# Patient Record
Sex: Male | Born: 1985 | Race: White | Hispanic: No | Marital: Single | Smoking: Never smoker
Health system: Southern US, Community
[De-identification: ages and names within clinical notes are randomized; demographics above are authoritative.]

## PROBLEM LIST (undated history)

## (undated) DIAGNOSIS — F32A Depression, unspecified: Secondary | ICD-10-CM

## (undated) DIAGNOSIS — F329 Major depressive disorder, single episode, unspecified: Secondary | ICD-10-CM

---

## 2012-09-15 ENCOUNTER — Ambulatory Visit: Payer: Self-pay | Admitting: Internal Medicine

## 2012-09-15 LAB — RAPID STREP-A WITH REFLX: Micro Text Report: NEGATIVE

## 2012-09-15 LAB — RAPID INFLUENZA A&B ANTIGENS

## 2012-09-17 LAB — BETA STREP CULTURE(ARMC)

## 2015-07-05 ENCOUNTER — Emergency Department
Admission: EM | Admit: 2015-07-05 | Discharge: 2015-07-06 | Disposition: A | Discharge status: Other Acute Inpatient Hospital | Payer: Commercial Managed Care - PPO | Attending: Emergency Medicine | Admitting: Emergency Medicine

## 2015-07-05 ENCOUNTER — Encounter: Payer: Self-pay | Admitting: Emergency Medicine

## 2015-07-05 ENCOUNTER — Observation Stay (HOSPITAL_COMMUNITY)
Admission: AD | Admit: 2015-07-05 | Payer: Federal, State, Local not specified - Other | Source: Intra-hospital | Admitting: Psychiatry

## 2015-07-05 DIAGNOSIS — Z791 Long term (current) use of non-steroidal anti-inflammatories (NSAID): Secondary | ICD-10-CM | POA: Insufficient documentation

## 2015-07-05 DIAGNOSIS — F322 Major depressive disorder, single episode, severe without psychotic features: Secondary | ICD-10-CM | POA: Diagnosis not present

## 2015-07-05 DIAGNOSIS — F329 Major depressive disorder, single episode, unspecified: Secondary | ICD-10-CM

## 2015-07-05 DIAGNOSIS — F332 Major depressive disorder, recurrent severe without psychotic features: Secondary | ICD-10-CM | POA: Insufficient documentation

## 2015-07-05 DIAGNOSIS — T1491 Suicide attempt: Secondary | ICD-10-CM | POA: Insufficient documentation

## 2015-07-05 DIAGNOSIS — F4325 Adjustment disorder with mixed disturbance of emotions and conduct: Secondary | ICD-10-CM

## 2015-07-05 DIAGNOSIS — R45851 Suicidal ideations: Secondary | ICD-10-CM

## 2015-07-05 DIAGNOSIS — F32A Depression, unspecified: Secondary | ICD-10-CM

## 2015-07-05 DIAGNOSIS — T1491XA Suicide attempt, initial encounter: Secondary | ICD-10-CM

## 2015-07-05 LAB — COMPREHENSIVE METABOLIC PANEL
ALT: 14 U/L — ABNORMAL LOW (ref 17–63)
ANION GAP: 6 (ref 5–15)
AST: 16 U/L (ref 15–41)
Albumin: 4.5 g/dL (ref 3.5–5.0)
Alkaline Phosphatase: 93 U/L (ref 38–126)
BUN: 13 mg/dL (ref 6–20)
CHLORIDE: 108 mmol/L (ref 101–111)
CO2: 29 mmol/L (ref 22–32)
Calcium: 9.8 mg/dL (ref 8.9–10.3)
Creatinine, Ser: 0.93 mg/dL (ref 0.61–1.24)
Glucose, Bld: 99 mg/dL (ref 65–99)
POTASSIUM: 3.8 mmol/L (ref 3.5–5.1)
Sodium: 143 mmol/L (ref 135–145)
Total Bilirubin: 0.5 mg/dL (ref 0.3–1.2)
Total Protein: 7.7 g/dL (ref 6.5–8.1)

## 2015-07-05 LAB — ACETAMINOPHEN LEVEL

## 2015-07-05 LAB — URINE DRUG SCREEN, QUALITATIVE (ARMC ONLY)
AMPHETAMINES, UR SCREEN: NOT DETECTED
BENZODIAZEPINE, UR SCRN: NOT DETECTED
Barbiturates, Ur Screen: NOT DETECTED
Cannabinoid 50 Ng, Ur ~~LOC~~: NOT DETECTED
Cocaine Metabolite,Ur ~~LOC~~: NOT DETECTED
MDMA (ECSTASY) UR SCREEN: NOT DETECTED
METHADONE SCREEN, URINE: NOT DETECTED
Opiate, Ur Screen: NOT DETECTED
Phencyclidine (PCP) Ur S: NOT DETECTED
TRICYCLIC, UR SCREEN: NOT DETECTED

## 2015-07-05 LAB — SALICYLATE LEVEL

## 2015-07-05 LAB — CBC
HCT: 47.6 % (ref 40.0–52.0)
Hemoglobin: 16.1 g/dL (ref 13.0–18.0)
MCH: 30.9 pg (ref 26.0–34.0)
MCHC: 33.9 g/dL (ref 32.0–36.0)
MCV: 91.3 fL (ref 80.0–100.0)
PLATELETS: 219 10*3/uL (ref 150–440)
RBC: 5.22 MIL/uL (ref 4.40–5.90)
RDW: 12.9 % (ref 11.5–14.5)
WBC: 7.5 10*3/uL (ref 3.8–10.6)

## 2015-07-05 LAB — ETHANOL: ALCOHOL ETHYL (B): 12 mg/dL — AB (ref <5)

## 2015-07-05 NOTE — BH Assessment (Signed)
Assessment Note  Hector Decker is an 29 y.o. male presenting to the ED after attempting suicide by hanging himself.  According to the patient he has been having marital issues with his wife, he recently cheated on her and feels very ashamed. Pt states he attempted to hang himself last night, unsuccessfully. He states he met with his counselor today who urged him to come to the emergency department for evaluation. Pt reports he does not feel safe returning home in his present state of mind.   Diagnosis: Suicidal  Past Medical History: History reviewed. No pertinent past medical history.  History reviewed. No pertinent past surgical history.  Family History: History reviewed. No pertinent family history.  Social History:  reports that he has never smoked. He has never used smokeless tobacco. He reports that he drinks about 3.6 oz of alcohol per week. He reports that he uses illicit drugs (Marijuana).  Additional Social History:  Alcohol / Drug Use History of alcohol / drug use?: No history of alcohol / drug abuse  CIWA: CIWA-Ar BP: 122/69 mmHg Pulse Rate: 65 COWS:    Allergies: No Known Allergies  Home Medications:  (Not in a hospital admission)  OB/GYN Status:  No LMP for male patient.  General Assessment Data Location of Assessment: [facility] ED TTS Assessment: In system Is this a Tele or Face-to-Face Assessment?: Face-to-Face Is this an Initial Assessment or a Re-assessment for this encounter?: Initial Assessment Marital status: Married [place] name: N/A Is patient pregnant?: No Pregnancy Status: No Living Arrangements: Spouse/significant other Can pt return to current living arrangement?: Yes Admission Status: Involuntary Is patient capable of signing voluntary admission?: No Referral Source: Self/Family/Friend Insurance type: None  Medical Screening Exam ([place]) Medical Exam completed: Yes  Crisis Care Plan Living Arrangements: Spouse/significant other Legal  Guardian: Other: (Self) [facility] of [facility]rist[facility] Name of Therapist: [facility]  Education Status Is patient currently in school?: No Current Grade: N/A Highest grade of school patient has completed: 12th Name of school: N/A Contact person: N/A  Risk to self with the past 6 months Suicidal Ideation: Yes-Currently Present Has patient been a risk to self within the past 6 months prior to admission? : No Suicidal Intent: Yes-Currently Present Has patient had any suicidal intent within the past 6 months prior to admission? : No Is patient at risk for suicide?: Yes Suicidal Plan?: Yes-Currently Present Has patient had any suicidal plan within the past 6 months prior to admission? : No Specify Current Suicidal Plan: Pt tried to hang self Access to Means: Yes Specify Access to Suicidal Means: Pt has access to rope What has been your use of drugs/alcohol within the last 12 months?: Occasional beer Previous Attempts/Gestures: No How many times?: 0 Other Self Harm Risks: None Triggers for Past Attempts: Spouse contact Intentional Self Injurious Behavior: None Family Suicide History: No Recent stressful life event(s): Conflict (Comment) (Relationship issues) Persecutory voices/beliefs?: No Depression: Yes Depression Symptoms: Feeling worthless/self pity Substance abuse history and/or treatment for substance abuse?: No Suicide prevention information given to non-admitted patients: Not applicable  Risk to Others within the past 6 months Homicidal Ideation: No Does patient have any lifetime risk of violence toward others beyond the six months prior to admission? : No Thoughts of Harm to Others: No Current Homicidal Intent: No Current Homicidal Plan: No Access to Homicidal Means: No Identified Victim: N/A History of harm to others?: No Assessment of Violence: None Noted Violent Behavior Description: N/A Does patient have access to  weapons?: No Criminal Charges Pending?:  No Does patient have a court date: No Is patient on probation?: No  Psychosis Hallucinations: None noted Delusions: None noted  Mental Status Report Appearance/Hygiene: In scrubs Eye Contact: Good Motor Activity: Freedom of movement Speech: Logical/coherent Level of Consciousness: Alert Mood: Depressed, Sad Affect: Depressed, Sad Anxiety Level: Minimal Thought Processes: Coherent Judgement: Partial Orientation: Person, Place, Time, Situation Obsessive Compulsive Thoughts/Behaviors: None  Cognitive Functioning Concentration: Normal Memory: Recent Intact, Remote Intact IQ: Average Insight: Poor Impulse Control: Poor Appetite: Good Weight Loss: 0 Weight Gain: 0 Sleep: No Change Total Hours of Sleep: 8 Vegetative Symptoms: None  ADLScreening [facility] Assessment Services) Patient's cognitive ability adequate to safely complete daily activities?: Yes Patient able to express need for assistance with ADLs?: Yes Independently performs ADLs?: Yes (appropriate for developmental age)  Prior Inpatient Therapy Prior Inpatient Therapy: Yes Prior Therapy Dates: N/A Prior Therapy Facilty/Provider(s): N/A Reason for Treatment: N/A  Prior Outpatient Therapy Prior Outpatient Therapy: Yes Prior Therapy Dates: current Prior [facility]): [facility] Reason for Treatment: Depression Does patient have an ACCT team?: No Does patient have Intensive In-House Services?  : No Does patient have [facility] services? : No Does patient have P4CC services?: No  ADL Screening (condition at time of admission) Patient's cognitive ability adequate to safely complete daily activities?: Yes Patient able to express need for assistance with ADLs?: Yes Independently performs ADLs?: Yes (appropriate for developmental age)       Abuse/Neglect Assessment (Assessment to be complete while patient is alone) Physical Abuse: Denies Verbal Abuse: Denies Sexual Abuse: Denies Exploitation of  patient/patient's resources: Denies Self-Neglect: Denies Values / Beliefs Cultural Requests During Hospitalization: None Spiritual Requests During Hospitalization: None Consults Spiritual Care Consult Needed: No Social Work Consult Needed: No Regulatory affairs officer (For Healthcare) Does patient have an advance directive?: No    Additional Information 1:1 In Past 12 Months?: No CIRT Risk: No Elopement Risk: No Does patient have medical clearance?: Yes     Disposition:  Disposition Initial Assessment Completed for this Encounter: Yes Disposition of Patient: Other dispositions Other disposition(s): Other (Comment) (Psych MD consult)  On Site Evaluation by:   Reviewed with Physician:    Oneita Hurt 07/05/2015 10:48 PM

## 2015-07-05 NOTE — ED Provider Notes (Signed)
[facility] Emergency Department Provider Note  Time seen: 6:09 PM  I have reviewed the triage vital signs and the nursing notes.   HISTORY  Chief Complaint Suicide Attempt    HPI Hector Decker is a 29 y.o. male with no past medical history who presents the emergency department after a suicide attempt. According to the patient he has been having marital issues with his wife, he recently cheated on her and feels very ashamed. States he attempted to hang himself last night, unsuccessfully. Today him and his wife went to a counselor who urged him to come to the emergency department for evaluation. Denies any medical complaints. States continued depression, with suicidal thoughts.     History reviewed. No pertinent past medical history.  There are no active problems to display for this patient.   History reviewed. No pertinent past surgical history.  Current Outpatient Rx  Name  Route  Sig  Dispense  Refill  . naproxen (NAPROSYN) 500 MG tablet   Oral   Take 500 mg by mouth 2 (two) times daily with a meal.           Allergies Review of patient's allergies indicates no known allergies.  History reviewed. No pertinent family history.  Social History Social History  Substance Use Topics  . Smoking status: Never Smoker   . Smokeless tobacco: Never Used  . Alcohol Use: 3.6 oz/week    6 Cans of beer per week    Review of Systems Constitutional: Negative for fever. ENT: Denies neck pain Cardiovascular: Negative for chest pain. Respiratory: Negative for shortness of breath. Gastrointestinal: Negative for abdominal pain Skin: Negative for rash. Negative for abrasions. Neurological: Negative for headache Psychiatric:Depression 10-point ROS otherwise negative.  ____________________________________________   PHYSICAL EXAM:  VITAL SIGNS: ED Triage Vitals  Enc Vitals Group     BP 07/05/15 1703 139/76 mmHg     Pulse Rate 07/05/15 1703 54    Resp --      Temp 07/05/15 1703 97.5 F (36.4 C)     Temp Source 07/05/15 1703 Oral     SpO2 07/05/15 1703 99 %     Weight 07/05/15 1703 185 lb (83.915 kg)     Height 07/05/15 1703  (1.727 m)     Head Cir --      Peak Flow --      Pain Score --      Pain Loc --      Pain Edu? --      Excl. in GC? --     Constitutional: Alert and oriented. Well appearing and in no distress. Eyes: Normal exam ENT   Head: Normocephalic and atraumatic.   Mouth/Throat: Mucous membranes are moist. Cardiovascular: Normal rate, regular rhythm. No murmur Respiratory: Normal respiratory effort without tachypnea nor retractions. Breath sounds are clear Gastrointestinal: Soft and nontender. No distention.  Musculoskeletal: Nontender with normal range of motion in all extremities.  Neurologic:  Normal speech and language. No gross focal neurologic deficits  Skin:  Skin is warm, dry and intact.  Psychiatric: Flat affect, admits to depression, admits suicide attempt.  ____________________________________________   INITIAL IMPRESSION / ASSESSMENT AND PLAN / ED COURSE  Pertinent labs & imaging results that were available during my care of the patient were reviewed by me and considered in my medical decision making (see chart for details).  Patient presents after unsuccessful suicide attempt last night due to recent marital issues. Patient depressed. We will place the patient under  an involuntary commitment until the patient can be evaluated by psychiatry. No medical complaints at this time. Blood work is largely within normal limits besides a slightly elevated alcohol. The patient admits to having had been under the influence of alcohol last night and when he attempted to hang himself. No neck abrasions or rash noted on exam.  ____________________________________________   FINAL CLINICAL IMPRESSION(S) / ED DIAGNOSES  Suicide attempt Depression   Minna AntisKevin Bradleigh Sonnen, MD 07/05/15 [phone]

## 2015-07-05 NOTE — ED Notes (Signed)
Report received from Atilade RN 

## 2015-07-05 NOTE — ED Notes (Signed)
Pt presents after attempting suicide last night. He states that he tried once before, but that it has been a long time. He reports that he has been feeling depressed for a couple of weeks, and that he had an argument with his wife, which caused him to attempt to hang himself. He was discovered by his wife, but was not injured, so he did not come in last night. He denies suicidal ideation "right this minute."

## 2015-07-05 NOTE — ED Notes (Signed)
Patient to ED-[facility] from ED ambulatory without difficulty to room #4.  Patient is alert and oriented, calm and cooperative and in no apparent distress currently.  Patient is oriented to the unit.  Patient denies any pain currently.  Patient denies any suicidal ideation, homicidal ideation, auditory or visual hallucinations at the present time.  Patient is made aware of security cameras and q.15 minute safety checks.  Patient is encouraged to notify staff with any of any questions or concerns.

## 2015-07-05 NOTE — ED Notes (Signed)
This RN given the pt's wedding band by Dena BilletAti O, RN; item placed in the pt's belongings bag.

## 2015-07-06 ENCOUNTER — Inpatient Hospital Stay
Admit: 2015-07-06 | Discharge: 2015-07-10 | DRG: 882 | Disposition: A | Discharge status: Home / Self Care | Payer: 59 | Attending: Psychiatry | Admitting: Psychiatry

## 2015-07-06 DIAGNOSIS — Z915 Personal history of self-harm: Secondary | ICD-10-CM

## 2015-07-06 DIAGNOSIS — F4325 Adjustment disorder with mixed disturbance of emotions and conduct: Principal | ICD-10-CM | POA: Diagnosis present

## 2015-07-06 DIAGNOSIS — F332 Major depressive disorder, recurrent severe without psychotic features: Secondary | ICD-10-CM | POA: Diagnosis not present

## 2015-07-06 DIAGNOSIS — G4700 Insomnia, unspecified: Secondary | ICD-10-CM | POA: Diagnosis present

## 2015-07-06 DIAGNOSIS — F322 Major depressive disorder, single episode, severe without psychotic features: Secondary | ICD-10-CM | POA: Diagnosis not present

## 2015-07-06 DIAGNOSIS — R45851 Suicidal ideations: Secondary | ICD-10-CM | POA: Diagnosis present

## 2015-07-06 HISTORY — DX: Depression, unspecified: F32.A

## 2015-07-06 HISTORY — DX: Major depressive disorder, single episode, unspecified: F32.9

## 2015-07-06 MED ORDER — TRAZODONE HCL 50 MG PO TABS
50.0000 mg | ORAL_TABLET | Freq: Every evening | ORAL | Status: DC | PRN
Start: 2015-07-06 — End: 2015-07-06

## 2015-07-06 MED ORDER — HYDROXYZINE HCL 25 MG PO TABS: 25.0000 mg | ORAL_TABLET | ORAL | Status: DC | PRN

## 2015-07-06 NOTE — ED Notes (Signed)
Patient currently denies SI/HI/AVH and pain. Patient states that he really is not sure what he needs psychologically but he has a lot of guilt related to cheating on his wife. Patient does say that he feels safer being in the hospital than at home. Patient states that at this point he does not have any thoughts of self harm and contracted with nurse to make staff aware if these thoughts were to occur.

## 2015-07-06 NOTE — ED Notes (Signed)
Patient currently taking a shower. Patient shows no signs of distress and has no complaints at this time. Maintained on 15 minute checks and observation by security camera for safety.

## 2015-07-06 NOTE — ED Notes (Signed)
Patient received breakfast tray 

## 2015-07-06 NOTE — BHH Counselor (Signed)
Patient has been evaluated by Dr. Toni Amendlapacs and meets criteria for inpatient admission.  [facility], assigned to room 319A, call report at 10 pm.

## 2015-07-06 NOTE — ED Notes (Signed)
Patient resting quietly in room. No noted distress or abnormal behaviors noted. Will continue 15 minute checks and observation by security camera for safety. 

## 2015-07-06 NOTE — ED Provider Notes (Signed)
-----------------------------------------   8:09 PM on 07/06/2015 -----------------------------------------   Blood pressure 118/72, pulse 71, temperature 98.4 F (36.9 C), temperature source Oral, resp. rate 18, height 5\' 8"  (1.727 m), weight 83.915 kg, SpO2 97 %.  Admit per Dr. Luvenia Reddenlapacs  Bernetha Anschutz, MD 07/06/15 2009

## 2015-07-06 NOTE — ED Notes (Signed)
Number of wife Maralyn SagoSarah- [phone]. Wife was notified that doctor was to see patient this evening.

## 2015-07-06 NOTE — ED Notes (Signed)
Patient asleep in room. No noted distress or abnormal behavior. Will continue 15 minute checks and observation by security cameras for safety. 

## 2015-07-06 NOTE — ED Notes (Signed)
Report received from Reather ConverseKarena T., RN. Pt. Alert and oriented in no distress verbalizing having SI; denies having HI, AVH and pain.  Pt. Instructed to come to me with problems or concerns.Will continue to monitor for safety via security cameras and Q 15 minute checks.

## 2015-07-06 NOTE — ED Notes (Signed)
Pt. Noted in room watching the tv;. No complaints or concerns voiced. No distress or abnormal behavior noted; to be admitted to the inpatient behavioral medicine unit; per Dr. Clapacs;  will continue to monitor with security cameras. Q 15 minute rounds continue. 

## 2015-07-06 NOTE — ED Notes (Signed)
Patient in the bed resting and watching television. No signs of distress noted. Maintained on 15 minute checks and observation by security camera for safety.

## 2015-07-06 NOTE — ED Notes (Signed)
Pt. Noted in  room watching the tv.;. No complaints or concerns voiced. No distress or abnormal behavior noted. Will continue to monitor with security cameras. Q 15 minute rounds continue. 

## 2015-07-06 NOTE — ED Notes (Signed)
Patient's wife came to visit him on the unit. Wife requests to be notified once the psychiatrist sees the patient. Patient shows no signs of distress at this time.  Maintained on 15 minute checks and observation by security camera for safety.

## 2015-07-06 NOTE — ED Notes (Signed)
ENVIRONMENTAL ASSESSMENT Potentially harmful objects out of patient reach: Yes Personal belongings secured: Yes Patient dressed in hospital provided attire only: Yes Plastic bags out of patient reach: Yes Patient care equipment (cords, cables, call bells, lines, and drains) shortened, removed, or accounted for: Yes Equipment and supplies removed from bottom of stretcher: Yes Potentially toxic materials out of patient reach: Yes Sharps container removed or out of patient reach: Yes  Patient currently in room sleeping. No signs of distress noted. Maintained on 15 minute checks and observation by security camera for safety.  

## 2015-07-06 NOTE — Consult Note (Signed)
[facility] Face-to-Face Psychiatry Consult   Reason for Consult:  Consult for this 29 year old man brought into the hospital after attempting to hang himself at home Referring Physician:  [place] Patient Identification: Hector Decker MRN:  [id number] Principal Diagnosis: Depression, major, single episode, severe ([place]) Diagnosis:   Patient Active Problem List   Diagnosis Date Noted  . Depression, major, single episode, severe ([place]) [F32.2] 07/06/2015  . Suicidal ideation [R45.851] 07/06/2015  . Adjustment disorder with mixed disturbance of emotions and conduct [F43.25] 07/06/2015    Total Time spent with patient: 1 hour  Subjective:   Hector Decker is a 29 y.o. male patient admitted with "I tried to hang myself and my wife stopped me".  HPI:  Information from the patient and the chart. Patient interviewed. Chart reviewed. Referral Notes reviewed. Labs reviewed. Patient states that on Wednesday he tried to hang himself using some rock climbing apparatus at home. He says he actually managed to hang briefly what his wife walked in on him and stopped him. Patient has been having suicidal ideation intermittently for the last week which has escalated. Mood is been sad and depressed for several weeks which is also escalated in the past week. He is sleeping poorly waking up frequently with panic attacks. He eats adequately. Denies that he's having any hallucinations. 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 The 2 of them of been going to marital counseling together but when she discovered that he had been cheating on her things got even worse. He feels very ashamed and negative about himself. Can't concentrate or focus. He admits that he drinks but says that it is rarely a problem. He had had some alcohol when he tried to hang himself but says that on average she only drinks about  1 beer every day or less. Denies that he abuses any other drugs. He is not currently on any psychiatric medicine but has been seeing a marital counselor.  Social history: Patient is married. He and his wife been together for 5 years. No children. 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  Medical history: Patient denies any significant medical problems. Not on any current medication. No history of heart disease high blood pressure or diabetes.  Substance abuse history: Patient says that his drinking has never been a problem and he does not use other drugs. Denies any substance abuse problems in the past.  Past Psychiatric History: Patient states that he made a suicide attempt at age 42 when his mother discovered that he was using pornography. He has never been in a psychiatric hospital. Never been on any psychiatric medicine. He has been seeing a marital counselor with his wife for at least a few months.  Risk to Self: Suicidal Ideation: Yes-Currently Present Suicidal Intent: Yes-Currently Present Is patient at risk for suicide?: Yes Suicidal Plan?: Yes-Currently Present Specify Current Suicidal Plan: Pt tried to hang self Access to Means: Yes Specify Access to Suicidal Means: Pt has access to rope What has been your use of drugs/alcohol within the last 12 months?: Occasional beer How many times?: 0 Other Self Harm Risks: None Triggers for Past Attempts: Spouse contact Intentional Self Injurious Behavior: None Risk to Others: Homicidal Ideation: No Thoughts of Harm to Others: No Current Homicidal Intent: No Current Homicidal Plan: No Access to Homicidal Means: No Identified Victim: N/A History of harm to others?: No Assessment  of Violence: None Noted Violent Behavior Description: N/A Does patient have access to weapons?: No Criminal Charges Pending?: No Does patient have a court date: No Prior Inpatient Therapy: Prior Inpatient Therapy: Yes Prior Therapy Dates: N/A Prior  Therapy Facilty/Provider(s): N/A Reason for Treatment: N/A Prior Outpatient Therapy: Prior Outpatient Therapy: Yes Prior Therapy Dates: current Prior [facility] Reason for Treatment: Depression Does patient have an ACCT team?: No Does patient have Intensive In-House Services?  : No Does patient have [facility] services? : No Does patient have P4[facility] services?: No  Past Medical History: History reviewed. No pertinent past medical history. History reviewed. No pertinent past surgical history. Family History: History reviewed. No pertinent family history. Family Psychiatric  History: Patient denies that being any family history at all of mental illness or substance abuse problems. Social History:  History  Alcohol Use  . 3.6 oz/week  . 6 Cans of beer per week     History  Drug Use  . Yes  . Special: Marijuana    Comment: occasional (6 x in last 6 months)    Social History   Social History  . Marital Status: Single    Spouse Name: N/A  . Number of Children: N/A  . Years of Education: N/A   Social History Main Topics  . Smoking status: Never Smoker   . Smokeless tobacco: Never Used  . Alcohol Use: 3.6 oz/week    6 Cans of beer per week  . Drug Use: Yes    Special: Marijuana     Comment: occasional (6 x in last 6 months)  . Sexual Activity: Not Asked   Other Topics Concern  . None   Social History Narrative  . None   Additional Social History:    History of alcohol / drug use?: No history of alcohol / drug abuse                     Allergies:  No Known Allergies  Labs:  Results for orders placed or performed during the hospital encounter of 07/05/15 (from the past 48 hour(s))  Comprehensive metabolic panel     Status: Abnormal   Collection Time: 07/05/15  5:11 PM  Result Value Ref Range   Sodium 143 135 - 145 mmol/L   Potassium 3.8 3.5 - 5.1 mmol/L   Chloride 108 101 - 111 mmol/L   CO2 29 22 - 32 mmol/L   Glucose, Bld 99 65  - 99 mg/dL   BUN 13 6 - 20 mg/dL   Creatinine, Ser 0.93 0.61 - 1.24 mg/dL   Calcium 9.8 8.9 - 10.3 mg/dL   Total Protein 7.7 6.5 - 8.1 g/dL   Albumin 4.5 3.5 - 5.0 g/dL   AST 16 15 - 41 U/L   ALT 14 (L) 17 - 63 U/L   Alkaline Phosphatase 93 38 - 126 U/L   Total Bilirubin 0.5 0.3 - 1.2 mg/dL   GFR calc non Af Amer >60 >60 mL/min   GFR calc Af Amer >60 >60 mL/min    Comment: (NOTE) The eGFR has been calculated using the CKD EPI equation. This calculation has not been validated in all clinical situations. eGFR's persistently <60 mL/min signify possible Chronic Kidney Disease.    Anion gap 6 5 - 15  Ethanol (ETOH)     Status: Abnormal   Collection Time: 07/05/15  5:11 PM  Result Value Ref Range   Alcohol, Ethyl (B) 12 (H) <5 mg/dL  Comment:        LOWEST DETECTABLE LIMIT FOR SERUM ALCOHOL IS 5 mg/dL FOR MEDICAL PURPOSES ONLY   Salicylate level     Status: None   Collection Time: 07/05/15  5:11 PM  Result Value Ref Range   Salicylate Lvl <1.6 2.8 - 30.0 mg/dL  Acetaminophen level     Status: Abnormal   Collection Time: 07/05/15  5:11 PM  Result Value Ref Range   Acetaminophen (Tylenol), Serum <10 (L) 10 - 30 ug/mL    Comment:        THERAPEUTIC CONCENTRATIONS VARY SIGNIFICANTLY. A RANGE OF 10-30 ug/mL MAY BE AN EFFECTIVE CONCENTRATION FOR MANY PATIENTS. HOWEVER, SOME ARE BEST TREATED AT CONCENTRATIONS OUTSIDE THIS RANGE. ACETAMINOPHEN CONCENTRATIONS >150 ug/mL AT 4 HOURS AFTER INGESTION AND >50 ug/mL AT 12 HOURS AFTER INGESTION ARE OFTEN ASSOCIATED WITH TOXIC REACTIONS.   CBC     Status: None   Collection Time: 07/05/15  5:11 PM  Result Value Ref Range   WBC 7.5 3.8 - 10.6 K/uL   RBC 5.22 4.40 - 5.90 MIL/uL   Hemoglobin 16.1 13.0 - 18.0 g/dL   HCT 47.6 40.0 - 52.0 %   MCV 91.3 80.0 - 100.0 fL   MCH 30.9 26.0 - 34.0 pg   MCHC 33.9 32.0 - 36.0 g/dL   RDW 12.9 11.5 - 14.5 %   Platelets 219 150 - 440 K/uL  Urine Drug Screen, Qualitative ([facility] only)     Status:  None   Collection Time: 07/05/15  7:45 PM  Result Value Ref Range   Tricyclic, Ur Screen NONE DETECTED NONE DETECTED   Amphetamines, Ur Screen NONE DETECTED NONE DETECTED   MDMA (Ecstasy)Ur Screen NONE DETECTED NONE DETECTED   Cocaine Metabolite,Ur [place] NONE DETECTED NONE DETECTED   Opiate, Ur Screen NONE DETECTED NONE DETECTED   Phencyclidine (PCP) Ur S NONE DETECTED NONE DETECTED   Cannabinoid 50 Ng, Ur [place] NONE DETECTED NONE DETECTED   Barbiturates, Ur Screen NONE DETECTED NONE DETECTED   Benzodiazepine, Ur Scrn NONE DETECTED NONE DETECTED   Methadone Scn, Ur NONE DETECTED NONE DETECTED    Comment: (NOTE) 109  Tricyclics, urine               Cutoff 1000 ng/mL 200  Amphetamines, urine             Cutoff 1000 ng/mL 300  MDMA (Ecstasy), urine           Cutoff 500 ng/mL 400  Cocaine Metabolite, urine       Cutoff 300 ng/mL 500  Opiate, urine                   Cutoff 300 ng/mL 600  Phencyclidine (PCP), urine      Cutoff 25 ng/mL 700  Cannabinoid, urine              Cutoff 50 ng/mL 800  Barbiturates, urine             Cutoff 200 ng/mL 900  Benzodiazepine, urine           Cutoff 200 ng/mL 1000 Methadone, urine                Cutoff 300 ng/mL 1100 1200 The urine drug screen provides only a preliminary, unconfirmed 1300 analytical test result and should not be used for non-medical 1400 purposes. Clinical consideration and professional judgment should 1500 be applied to any positive drug screen result due to possible 1600 interfering substances. A more  specific alternate chemical method 1700 must be used in order to obtain a confirmed analytical result.  1800 Gas chromato graphy / mass spectrometry (GC/MS) is the preferred 1900 confirmatory method.     No current facility-administered medications for this encounter.   Current Outpatient Prescriptions  Medication Sig Dispense Refill  . naproxen (NAPROSYN) 500 MG tablet Take 500 mg by mouth 2 (two) times daily with a meal.       Musculoskeletal: Strength & Muscle Tone: within normal limits Gait & Station: normal Patient leans: N/A  Psychiatric Specialty Exam: Review of Systems  Constitutional: Negative.   HENT: Negative.   Eyes: Negative.   Respiratory: Negative.   Cardiovascular: Negative.   Gastrointestinal: Negative.   Musculoskeletal: Negative.   Skin: Negative.   Neurological: Negative.   Psychiatric/Behavioral: Positive for depression, suicidal ideas and substance abuse. Negative for hallucinations and memory loss. The patient is nervous/anxious and has insomnia.     Blood pressure 118/72, pulse 71, temperature 98.4 F (36.9 C), temperature source Oral, resp. rate 18, height 5' 8"  (1.727 m), weight 83.915 kg (185 lb), SpO2 97 %.Body mass index is 28.14 kg/(m^2).  General Appearance: Disheveled  Eye Contact::  Minimal  Speech:  Slow  Volume:  Decreased  Mood:  Dysphoric  Affect:  Tearful  Thought Process:  Linear  Orientation:  Full (Time, Place, and Person)  Thought Content:  Negative  Suicidal Thoughts:  Yes.  with intent/plan  Homicidal Thoughts:  No  Memory:  Immediate;   Good Recent;   Good Remote;   Good  Judgement:  Fair  Insight:  Fair  Psychomotor Activity:  Decreased  Concentration:  Fair  Recall:  [employer] of Knowledge:Fair  Language: Fair  Akathisia:  No  Handed:  Right  AIMS (if indicated):     Assets:  Communication Skills Desire for Improvement Financial Resources/Insurance Housing Intimacy Physical Health Resilience Social Support  ADL's:  Intact  Cognition: WNL  Sleep:      Treatment Plan Summary: Daily contact with patient to assess and evaluate symptoms and progress in treatment, Medication management and Plan Patient will be admitted to the psychiatric hospital. Continue involuntary commitment. Patient appears to be at somewhat elevated risk for suicide. He will be on 15 minute checks. I suggest that we start him on a low dose of trazodone to assist  with sleep and hydroxyzine when necessary. Defer further decisions about antidepressants to the inpatient treatment team. Patient educated about inpatient treatment and is agreeable to the plan. Continue IVC. Case reviewed with ER doctor. Labs are all unremarkable.  Disposition: Recommend psychiatric Inpatient admission when medically cleared.  John Clapacs 07/06/2015 8:49 PM

## 2015-07-06 NOTE — ED Notes (Signed)
Pt. Noted in room resting quietly;. No complaints or concerns voiced. No distress or abnormal behavior noted. Will continue to monitor with security cameras. Q 15 minute rounds continue. 

## 2015-07-06 NOTE — ED Provider Notes (Signed)
-----------------------------------------   5:59 AM on 07/06/2015 -----------------------------------------   Blood pressure 122/69, pulse 65, temperature 97.5 F (36.4 C), temperature source Oral, resp. rate 18, height 5\' 8"  (1.727 m), weight 185 lb (83.915 kg), SpO2 97 %.  The patient had no acute events since last update.  Calm and cooperative at this time.  Disposition is pending per Psychiatry/Behavioral Medicine team recommendations.     Irean HongJade J Sung, MD 07/06/15 [phone]

## 2015-07-07 ENCOUNTER — Encounter: Payer: Self-pay | Admitting: Behavioral Health

## 2015-07-07 DIAGNOSIS — F332 Major depressive disorder, recurrent severe without psychotic features: Secondary | ICD-10-CM

## 2015-07-07 LAB — TSH: TSH: 0.978 u[IU]/mL (ref 0.350–4.500)

## 2015-07-07 LAB — LIPID PANEL
CHOL/HDL RATIO: 3.1 ratio
CHOLESTEROL: 135 mg/dL (ref 0–200)
HDL: 44 mg/dL (ref >40)
LDL Cholesterol: 70 mg/dL (ref 0–99)
Triglycerides: 105 mg/dL (ref <150)
VLDL: 21 mg/dL (ref 0–40)

## 2015-07-07 MED ORDER — ACETAMINOPHEN 325 MG PO TABS: 650.0000 mg | ORAL_TABLET | Freq: Four times a day (QID) | ORAL | Status: DC | PRN

## 2015-07-07 MED ORDER — TRAZODONE HCL 50 MG PO TABS: 50.0000 mg | ORAL_TABLET | Freq: Every evening | ORAL | Status: DC | PRN

## 2015-07-07 MED ORDER — ALUM & MAG HYDROXIDE-SIMETH 200-200-20 MG/5ML PO SUSP: 30.0000 mL | ORAL | Status: DC | PRN

## 2015-07-07 MED ORDER — TRAZODONE HCL 100 MG PO TABS
100.0000 mg | ORAL_TABLET | Freq: Every day | ORAL | Status: DC
  Administered 2015-07-07 – 2015-07-09 (×3): 100 mg via ORAL
  Filled 2015-07-07 (×3): qty 1

## 2015-07-07 MED ORDER — ESCITALOPRAM OXALATE 10 MG PO TABS
10.0000 mg | ORAL_TABLET | Freq: Every day | ORAL | Status: DC
  Administered 2015-07-07 – 2015-07-09 (×3): 10 mg via ORAL
  Filled 2015-07-07 (×4): qty 1

## 2015-07-07 MED ORDER — HYDROXYZINE HCL 25 MG PO TABS
25.0000 mg | ORAL_TABLET | ORAL | Status: DC | PRN
  Administered 2015-07-07: 25 mg via ORAL
  Filled 2015-07-07: qty 1

## 2015-07-07 MED ORDER — MAGNESIUM HYDROXIDE 400 MG/5ML PO SUSP
30.0000 mL | Freq: Every day | ORAL | Status: DC | PRN
Start: 2015-07-07 — End: 2015-07-10

## 2015-07-07 NOTE — [facility] Group Notes (Signed)
[facility] Group Notes:  (Nursing/MHT/Case Management/Adjunct)  Date:  07/07/2015  Time:  9:25 PM  Type of Therapy:  Group Therapy  Participation Level:  Active  Participation Quality:  Appropriate  Affect:  Appropriate  Cognitive:  Appropriate  Insight:  Appropriate  Engagement in Group:  None  Modes of Intervention:  Support  Summary of Progress/Problems:  Murrell ReddenOlivia Briana Morgan-Little 07/07/2015, 9:25 PM

## 2015-07-07 NOTE — H&P (Signed)
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 Major depression ([facility]) [F32.9] 07/07/2015  . Depression, major, single episode, severe ([facility]) [F32.2] 07/06/2015  . Suicidal ideation [R45.851] 07/06/2015  . Adjustment disorder with mixed disturbance of emotions and conduct [F43.25] 07/06/2015   History of Present Illness::    HPI: Patient is a 29 year old male who was admitted from the emergency room. Most of the history was obtained from the patient as well as review of the chart. According to the patient he tried to hang himself on Wednesday using some rock climbing apparatus at home. He was found by his wife who walked up on him and then stopped him and he was brought to the hospital. Patient reported that she became hysterical and upset with the situation. Patient reported that he has been feeling depressed for the past one month after his wife found out about his affair. He reported that he was having an affair with a coworker at work. He reported that he kept lying about it but then his wife found out about her to his email and messages. 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 The 2 of them of been going to marital counseling since March and has seen 3 different counselors but he reported that he did not notice an improvement in his symptoms. Patient reported that he feels that he has 2 different personalities and he is very outgoing and social in certain situations but when he is alone he feels very depressed sad and feels guilty.  Patient reported that he has never used any drugs or alcohol he stated that he has  been married for the past 2 years but was with his wife for the past 5 years.  Patient is not taking any psychotropic medications at this time. He reported that he feels guilty and wants help with his addiction as well as with his depression.    Social history: Patient is married for the last 2 years. He and his wife been together for 5 years. No children. 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  Medical history: Patient denies any significant medical problems. Not on any current medication. No history of heart disease high blood pressure or diabetes.  Substance abuse history: Patient says that his drinking has never been a problem and he does not use other drugs. Denies any substance abuse problems in the past.  Past Psychiatric History: Patient states that he made a suicide attempt at age 29 when his mother discovered that he was using pornography. He has never been in a psychiatric hospital. Never been on any psychiatric medicine. He has been seeing a marital counselor with his wife for at least a few months.  Associated Signs/Symptoms: Depression Symptoms:  depressed mood, psychomotor retardation, fatigue, hopelessness, suicidal attempt, anxiety, loss of energy/fatigue, (Hypo) Manic Symptoms:  Impulsivity, Irritable Mood, Anxiety Symptoms:  Excessive Worry, Psychotic Symptoms:  none PTSD Symptoms: Negative NA Total Time spent with patient: 1 hour  Past Psychiatric History:    Risk to Self: Is patient at risk for suicide?: Yes Risk to Others:   Prior Inpatient Therapy:   Prior Outpatient Therapy:    Alcohol Screening: 1. How often do  you have a drink containing alcohol?: 2 to 3 times a week 2. How many drinks containing alcohol do you have on a typical day when you are drinking?: 1 or 2 3. How often do you have six or more drinks on one occasion?: Less than monthly Preliminary Score: 1 4. How often during the last year have you found that you were not able to stop  drinking once you had started?: Never 5. How often during the last year have you failed to do what was normally expected from you becasue of drinking?: Never 6. How often during the last year have you needed a first drink in the morning to get yourself going after a heavy drinking session?: Never 7. How often during the last year have you had a feeling of guilt of remorse after drinking?: Never 8. How often during the last year have you been unable to remember what happened the night before because you had been drinking?: Never 9. Have you or someone else been injured as a result of your drinking?: No 10. Has a relative or friend or a doctor or another health worker been concerned about your drinking or suggested you cut down?: No Alcohol Use Disorder Identification Test Final Score (AUDIT): 4 Brief Intervention: AUDIT score less than 7 or less-screening does not suggest unhealthy drinking-brief intervention not indicated Substance Abuse History in the last 12 months:  No. Consequences of Substance Abuse: Negative NA Previous Psychotropic Medications:   Psychological Evaluations:   Past Medical History:  Past Medical History  Diagnosis Date  . Depression    History reviewed. No pertinent past surgical history. Family History: History reviewed. No pertinent family history. Family Psychiatric  History:    Social History:  History  Alcohol Use  . 3.6 oz/week  . 6 Cans of beer per week    Comment: 2-3 times a week     History  Drug Use  . Yes  . Special: Marijuana    Comment: once a month if that    Social History   Social History  . Marital Status: Single    Spouse Name: N/A  . Number of Children: N/A  . Years of Education: N/A   Social History Main Topics  . Smoking status: Never Smoker   . Smokeless tobacco: Never Used  . Alcohol Use: 3.6 oz/week    6 Cans of beer per week     Comment: 2-3 times a week  . Drug Use: Yes    Special: Marijuana     Comment: once a  month if that  . Sexual Activity: Yes    Birth Control/ Protection: Pill   Other Topics Concern  . None   Social History Narrative   Additional Social History:    History of alcohol / drug use?: No history of alcohol / drug abuse                    Allergies:  No Known Allergies Lab Results:  Results for orders placed or performed during the hospital encounter of 07/06/15 (from the past 48 hour(s))  Lipid panel, fasting     Status: None   Collection Time: 07/07/15  6:50 AM  Result Value Ref Range   Cholesterol 135 0 - 200 mg/dL   Triglycerides 952 <841 mg/dL   HDL 44 >32 mg/dL   Total CHOL/HDL Ratio 3.1 RATIO   VLDL 21 0 - 40 mg/dL   LDL Cholesterol 70 0 - 99 mg/dL  Comment:        Total Cholesterol/HDL:CHD Risk Coronary Heart Disease Risk Table                     Men   Women  1/2 Average Risk   3.4   3.3  Average Risk       5.0   4.4  2 X Average Risk   9.6   7.1  3 X Average Risk  23.4   11.0        Use the calculated Patient Ratio above and the CHD Risk Table to determine the patient's CHD Risk.        ATP III CLASSIFICATION (LDL):  <100     mg/dL   Optimal  811-914  mg/dL   Near or Above                    Optimal  130-159  mg/dL   Borderline  782-956  mg/dL   High  >213     mg/dL   Very High   TSH     Status: None   Collection Time: 07/07/15  6:50 AM  Result Value Ref Range   TSH 0.978 0.350 - 4.500 uIU/mL    Metabolic Disorder Labs:  No results found for: HGBA1C, MPG No results found for: PROLACTIN Lab Results  Component Value Date   CHOL 135 07/07/2015   TRIG 105 07/07/2015   HDL 44 07/07/2015   CHOLHDL 3.1 07/07/2015   VLDL 21 07/07/2015   LDLCALC 70 07/07/2015    Current Medications: Current Facility-Administered Medications  Medication Dose Route Frequency Provider Last Rate Last Dose  . acetaminophen (TYLENOL) tablet 650 mg  650 mg Oral Q6H PRN Audery Amel, MD      . alum & mag hydroxide-simeth (MAALOX/MYLANTA) 200-200-20  MG/5ML suspension 30 mL  30 mL Oral Q4H PRN Audery Amel, MD      . hydrOXYzine (ATARAX/VISTARIL) tablet 25 mg  25 mg Oral Q4H PRN Audery Amel, MD   25 mg at 07/07/15 0032  . magnesium hydroxide (MILK OF MAGNESIA) suspension 30 mL  30 mL Oral Daily PRN Audery Amel, MD      . traZODone (DESYREL) tablet 50 mg  50 mg Oral QHS PRN Audery Amel, MD       PTA Medications: Prescriptions prior to admission  Medication Sig Dispense Refill Last Dose  . naproxen (NAPROSYN) 500 MG tablet Take 500 mg by mouth 2 (two) times daily with a meal.   Past Week at Unknown time    Musculoskeletal: Strength & Muscle Tone: within normal limits Gait & Station: normal Patient leans: N/A  Psychiatric Specialty Exam: Physical Exam  ROS  Blood pressure 125/78, pulse 75, temperature 98.2 F (36.8 C), temperature source Oral, resp. rate 18, height 5\' 8"  (1.727 m), weight 182 lb (82.555 kg), SpO2 100 %.Body mass index is 27.68 kg/(m^2).  General Appearance: Casual  Eye Contact::  Fair  Speech:  Clear and Coherent and Normal Rate  Volume:  Decreased  Mood:  Anxious and Depressed  Affect:  Congruent and Depressed  Thought Process:  Coherent  Orientation:  Full (Time, Place, and Person)  Thought Content:  WDL  Suicidal Thoughts:  Yes.  with intent/plan  Homicidal Thoughts:  No  Memory:  Immediate;   Fair  Judgement:  Fair  Insight:  Fair  Psychomotor Activity:  Normal  Concentration:  Fair  Recall:  [employer] of  Knowledge:Fair  Language: Fair  Akathisia:  No  Handed:  Right  AIMS (if indicated):     Assets:  Communication Skills Desire for Improvement Housing Physical Health Social Support  ADL's:  Intact  Cognition: WNL  Sleep:  Number of Hours: 3.75     Treatment Plan Summary: Daily contact with patient to assess and evaluate symptoms and progress in treatment and Medication management  Observation Level/Precautions:  Continuous Observation  Laboratory:  CBC Chemistry Profile   Psychotherapy:    Medications:    Consultations:    Discharge Concerns:    Estimated LOS:  Other:     I certify that inpatient services furnished can reasonably be expected to improve the patient's condition.     Discussed with patient about the medications treatment risk benefits and alternatives I will start him on Lexapro 10 mg by mouth daily to help with his depressive symptoms  I will also titrate the dose of trazodone to help with sleep  He will continue with therapy after his discharge from the hospital  We will continue to monitor him at this time for his depression and suicidal ideations    Jalila Goodnough 12/10/201610:47 AM
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
## 2015-07-07 NOTE — Tx Team (Signed)
Initial Interdisciplinary Treatment Plan   PATIENT STRESSORS: Marital or family conflict   PATIENT STRENGTHS: Ability for insight Average or above average intelligence Capable of independent living Licensed conveyancerCommunication skills Financial means Motivation for treatment/growth Physical Health Supportive family/friends Work skills   PROBLEM LIST: Problem List/Patient Goals Date to be addressed Date deferred Reason deferred Estimated date of resolution  Depression 07/07/2015   07/14/2015  Suicide Attempt 07/07/2015   07/14/2015  Divorce Pending 07/07/2015   07/14/2015                                       DISCHARGE CRITERIA:  Improved stabilization in mood, thinking, and/or behavior Need for constant or close observation no longer present Reduction of life-threatening or endangering symptoms to within safe limits Verbal commitment to aftercare and medication compliance  PRELIMINARY DISCHARGE PLAN: Attend aftercare/continuing care group Outpatient therapy Participate in family therapy Return to previous living arrangement Return to previous work or school arrangements  PATIENT/FAMIILY INVOLVEMENT: This treatment plan has been presented to and reviewed with the patient, Weyman CroonJared K Guthmiller, and/or family member.  The patient and family have been given the opportunity to ask questions and make suggestions. Pt states that he wishes to overcome suicidal thoughts.  Arlean Thies Shari Prowsvan 07/07/2015, 2:10 AM

## 2015-07-07 NOTE — [facility] Group Notes (Signed)
[facility] Group Notes:  (Nursing/MHT/Case Management/Adjunct)  Date:  07/07/2015  Time:  11:02 AM  Type of Therapy:  Psychoeducational Skills  Participation Level:  Active  Participation Quality:  Appropriate  Affect:  Appropriate  Cognitive:  Alert  Insight:  Appropriate  Engagement in Group:  Engaged  Modes of Intervention:  Education and Support  Summary of Progress/Problems:  Owens CorningJamila A Eunie Decker 07/07/2015, 11:02 AM

## 2015-07-07 NOTE — [facility] Group Notes (Signed)
[facility] LCSW Group Therapy  07/07/2015 2:32 PM  Type of Therapy:  Group Therapy  Participation Level:  Active  Participation Quality:  Attentive and reserved unless asked directly to participate  Affect:  Blunted and Flat  Cognitive:  Appropriate  Insight:  Improving  Engagement in Therapy:  Engaged  Modes of Intervention:  Discussion, Education, Problem-solving, Socialization and Support  Summary of Progress/Problems:Reviewed poem, [facility].  Talked about finding purpose in emotions and challenges experienced.  Pt's identified who their support system will be at discharge and strategies they plan to use to stay focused on the good that can come from facing hardships.  Practiced statements, I may be________, but __________.  Pt filled in this statement with I may be ashamed, but I'm not alone.  Glennon MacLaws, Kody Vigil P, MSW, LCSW 07/07/2015, 2:32 PM

## 2015-07-08 MED ORDER — NAPROXEN SODIUM 550 MG PO TABS
550.0000 mg | ORAL_TABLET | Freq: Two times a day (BID) | ORAL | Status: DC
  Filled 2015-07-08 (×3): qty 1

## 2015-07-08 MED ORDER — NAPROXEN 500 MG PO TABS
500.0000 mg | ORAL_TABLET | Freq: Two times a day (BID) | ORAL | Status: DC
  Administered 2015-07-08 – 2015-07-10 (×5): 500 mg via ORAL
  Filled 2015-07-08 (×5): qty 1
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
## 2015-07-08 NOTE — Progress Notes (Signed)
[facility]  07/08/2015 4:28 PM Hector Decker  MRN:  [id number] Subjective:    HPI: Patient is a 29 year old male who was admitted from the emergency room for depression and trying to hang himself. He was seen for follow-up. He reported that he is doing well and his wife visited him yesterday. He reported that his wife is also taking Lexapro. He has discussed the medications with her. He reported that he is trying to feel better. He has been attending the groups on a regular basis. He currently denied having any adverse effects of the medications. He appeared calm and collected during the interview. He reported that his wife is very supportive and they are planning to continue with the marital counseling once he becomes clinically stable. He currently denied having any suicidal ideations or plans. Patient is not taking any psychotropic medications or his admission to the hospital.   Social history: Patient is married for the last 2 years. He and his wife been together for 5 years. No children. 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  Medical history: Patient denies any significant medical problems. Not on any current medication. No history of heart disease high blood pressure or diabetes.  Substance abuse history: Patient says that his drinking has never been a problem and he does not use other drugs. Denies any substance abuse problems in the past.  Past Psychiatric History: Patient states that he made a suicide attempt at age 29 when his mother discovered that he was using pornography. He has never been in a psychiatric hospital. Never been on any psychiatric medicine. He has been seeing a marital counselor with his wife for at least a few months.  Associated Signs/Symptoms: Depression Symptoms: depressed mood, psychomotor retardation, fatigue, hopelessness, suicidal attempt, anxiety, loss of energy/fatigue, (Hypo) Manic Symptoms: Impulsivity, Irritable  Mood, Anxiety Symptoms: Excessive Worry, Psychotic Symptoms: none PTSD Symptoms: Negative NA Total Time spent with patient: 1 hour  Past Psychiatric History:  Principal Problem: Major depressive disorder recurrent severe Diagnosis:   Patient Active Problem List   Diagnosis Date Noted  . Major depression (HCC) [F32.9] 07/07/2015  . Depression, major, single episode, severe (HCC) [F32.2] 07/06/2015  . Suicidal ideation [R45.851] 07/06/2015  . Adjustment disorder with mixed disturbance of emotions and conduct [F43.25] 07/06/2015   Total Time spent with patient: 30 minutes    Past Medical History:  Past Medical History  Diagnosis Date  . Depression    History reviewed. No pertinent past surgical history. Family History: History reviewed. No pertinent family history.  Social History:  History  Alcohol Use  . 3.6 oz/week  . 6 Cans of beer per week    Comment: 2-3 times a week     History  Drug Use  . Yes  . Special: Marijuana    Comment: once a month if that    Social History   Social History  . Marital Status: Single    Spouse Name: N/A  . Number of Children: N/A  . Years of Education: N/A   Social History Main Topics  . Smoking status: Never Smoker   . Smokeless tobacco: Never Used  . Alcohol Use: 3.6 oz/week    6 Cans of beer per week     Comment: 2-3 times a week  . Drug Use: Yes    Special: Marijuana     Comment: once a month if that  . Sexual Activity: Yes    Birth Control/ Protection: Pill   Other  Topics Concern  . None   Social History Narrative   Additional Social History:    History of alcohol / drug use?: No history of alcohol / drug abuse                    Sleep: Fair  Appetite:  Fair  Current Medications: Current Facility-Administered Medications  Medication Dose Route Frequency Provider Last Rate Last Dose  . acetaminophen (TYLENOL) tablet 650 mg  650 mg Oral Q6H PRN Audery Amel, MD      . alum & mag  hydroxide-simeth (MAALOX/MYLANTA) 200-200-20 MG/5ML suspension 30 mL  30 mL Oral Q4H PRN Audery Amel, MD      . escitalopram (LEXAPRO) tablet 10 mg  10 mg Oral Daily Brandy Hale, MD   10 mg at 07/08/15 0841  . hydrOXYzine (ATARAX/VISTARIL) tablet 25 mg  25 mg Oral Q4H PRN Audery Amel, MD   25 mg at 07/07/15 0032  . magnesium hydroxide (MILK OF MAGNESIA) suspension 30 mL  30 mL Oral Daily PRN Audery Amel, MD      . naproxen (NAPROSYN) tablet 500 mg  500 mg Oral BID WC Crystal G Scarpena, RPH   500 mg at 07/08/15 1237  . traZODone (DESYREL) tablet 100 mg  100 mg Oral QHS Brandy Hale, MD   100 mg at 07/07/15 2112    Lab Results:  Results for orders placed or performed during the hospital encounter of 07/06/15 (from the past 48 hour(s))  Lipid panel, fasting     Status: None   Collection Time: 07/07/15  6:50 AM  Result Value Ref Range   Cholesterol 135 0 - 200 mg/dL   Triglycerides 045 <409 mg/dL   HDL 44 >81 mg/dL   Total CHOL/HDL Ratio 3.1 RATIO   VLDL 21 0 - 40 mg/dL   LDL Cholesterol 70 0 - 99 mg/dL    Comment:        Total Cholesterol/HDL:CHD Risk Coronary Heart Disease Risk Table                     Men   Women  1/2 Average Risk   3.4   3.3  Average Risk       5.0   4.4  2 X Average Risk   9.6   7.1  3 X Average Risk  23.4   11.0        Use the calculated Patient Ratio above and the CHD Risk Table to determine the patient's CHD Risk.        ATP III CLASSIFICATION (LDL):  <100     mg/dL   Optimal  191-478  mg/dL   Near or Above                    Optimal  130-159  mg/dL   Borderline  295-621  mg/dL   High  >308     mg/dL   Very High   TSH     Status: None   Collection Time: 07/07/15  6:50 AM  Result Value Ref Range   TSH 0.978 0.350 - 4.500 uIU/mL    Physical Findings: AIMS: Facial and Oral Movements Muscles of Facial Expression: None, normal Lips and Perioral Area: None, normal Jaw: None, normal Tongue: None, normal,Extremity Movements Upper (arms,  wrists, hands, fingers): None, normal Lower (legs, knees, ankles, toes): None, normal, Trunk Movements Neck, shoulders, hips: None, normal, Overall Severity Severity of abnormal movements (highest  score from questions above): None, normal Incapacitation due to abnormal movements: None, normal, Dental Status Current problems with teeth and/or dentures?: No Does patient usually wear dentures?: No  CIWA:    COWS:     Musculoskeletal: Strength & Muscle Tone: within normal limits Gait & Station: normal Patient leans: N/A  Psychiatric Specialty Exam: ROS  Blood pressure 105/55, pulse 69, temperature 98.2 F (36.8 C), temperature source Oral, resp. rate 18, height  (1.727 m), weight 182 lb (82.555 kg), SpO2 100 %.Body mass index is 27.68 kg/(m^2).  General Appearance: Casual and Fairly Groomed  Patent attorney::  Fair  Speech:  Clear and Coherent and Normal Rate  Volume:  Decreased  Mood:  Anxious and Depressed  Affect:  Appropriate and Congruent  Thought Process:  Coherent and Goal Directed  Orientation:  Full (Time, Place, and Person)  Thought Content:  WDL  Suicidal Thoughts:  Yes.  with intent/plan  Homicidal Thoughts:  No  Memory:  Immediate;   Fair  Judgement:  Fair  Insight:  Fair  Psychomotor Activity:  Normal  Concentration:  Fair  Recall:  [employer] of Knowledge:Fair  Language: Fair  Akathisia:  No  Handed:  Right  AIMS (if indicated):     Assets:  Communication Skills Desire for Improvement Physical Health Social Support Transportation  ADL's:  Intact  Cognition: WNL  Sleep:  Number of Hours: 7   Treatment Plan Summary: Daily contact with patient to assess and evaluate symptoms and progress in treatment and Medication management  Brandy Hale 07/08/2015, 4:28 PM

## 2015-07-08 NOTE — Progress Notes (Signed)
D: Pt is awake and active in the milieu this evening. Pt mood is sad and his affect is flat. Pt denies SI/HI and AVH at this time.  A: Writer provided emotional support and administered medications as prescribed.   R: Pt is somewhat isolative, but he is pleasant and cooperative with staff.

## 2015-07-08 NOTE — [facility] Group Notes (Signed)
[facility] Group Notes:  (Nursing/MHT/Case Management/Adjunct)  Date:  07/08/2015  Time:  10:09 PM  Type of Therapy:  Group Therapy  Participation Level:  Active  Participation Quality:  Appropriate  Affect:  Appropriate  Cognitive:  Appropriate  Insight:  Good  Engagement in Group:  Engaged  Modes of Intervention:  n/a  Summary of Progress/Problems:  Hector Decker 07/08/2015, 10:09 PM

## 2015-07-08 NOTE — Plan of Care (Signed)
Problem: Diagnosis: Increased Risk For Suicide Attempt Goal: LTG-Patient Will Report Improved Mood and Deny Suicidal LTG (by discharge) Patient will report improved mood and deny suicidal ideation.  Outcome: Progressing Pt reports feeling better than yesterday and denies SI.

## 2015-07-08 NOTE — [facility] Group Notes (Signed)
[facility] LCSW Group Therapy  07/08/2015 3:21 PM  Type of Therapy:  Group Therapy  Participation Level:  Active  Participation Quality:  Attentive  Affect:  Appropriate  Cognitive:  Alert  Insight:  Improving  Engagement in Therapy:  Improving  Modes of Intervention:  Discussion, Education, Socialization and Support  Summary of Progress/Problems: Todays topic: Grudges  Patients will be encouraged to discuss their thoughts, feelings, and behaviors as to why one holds on to grudges and reasons why people have grudges. Patients will process the impact of grudges on their daily lives and identify thoughts and feelings related to holding grudges. Patients will identify feelings and thoughts related to what life would look like without grudges. Hector Decker attended group and stayed the entire time. He discussed holding a grudge against his mother and himself. He discussed letting it impact his life even though the event occurred in the past. He discussed focusing that energy into something productive like exercise and his marriage.   Sempra EnergyCandace L Alezander Decker MSW, LCSWA  07/08/2015, 3:21 PM

## 2015-07-08 NOTE — Progress Notes (Signed)
Pt has been pleasant and cooperative. Pt completed his self inventory. Pt denies SI and A/V hallucinations. Pt's mood and affect has been depressed. Pt has been more active on the unit. Pt rates his depression 3/10, hopelessness 3/10 and anxiety 5/10.

## 2015-07-08 NOTE — Progress Notes (Signed)
D: Pt is awake and active in the milieu this evening. Family visited. Pt mood is sad and his affect is flat. Pt denies SI/HI and AVH at this time.  A:  provided emotional support and administered medications as prescribed.   R: Pt is somewhat isolative, but he is pleasant and cooperative with staff, attended group.

## 2015-07-08 NOTE — [facility] Suicide Risk Assessment (Addendum)
[facility] INPATIENT:  Family/Significant Other Suicide Prevention Education  Suicide Prevention Education:  Education Completed; Hector HearingSarah Decker (wife) [phone], has been identified by the patient as the family member/significant other with whom the patient will be residing, and identified as the person(s) who will aid the patient in the event of a mental health crisis (suicidal ideations/suicide attempt).  With written consent from the patient, the family member/significant other has been provided the following suicide prevention education, prior to the and/or following the discharge of the patient.  The suicide prevention education provided includes the following:  Suicide risk factors  Suicide prevention and interventions  National Suicide Hotline telephone number  [facility] assessment telephone number  [facility] Emergency Assistance 911  [facility] and/or Residential Mobile Crisis Unit telephone number  Request made of family/significant other to:  Remove weapons (e.g., guns, rifles, knives), all items previously/currently identified as safety concern.    Remove drugs/medications (over-the-counter, prescriptions, illicit drugs), all items previously/currently identified as a safety concern.  The family member/significant other verbalizes understanding of the suicide prevention education information provided.  The family member/significant other agrees to remove the items of safety concern listed above. Mrs. Hector Decker reports the pull up bar and ropes used during the suicide attempt has been removed from the home.    Hector Decker L Hector Decker MSW, LCSWA  07/08/2015, 1:58 PM

## 2015-07-09 ENCOUNTER — Encounter: Payer: Self-pay | Admitting: Psychiatry

## 2015-07-09 DIAGNOSIS — F4325 Adjustment disorder with mixed disturbance of emotions and conduct: Principal | ICD-10-CM

## 2015-07-09 NOTE — Progress Notes (Signed)
   07/09/15 1100  Clinical Encounter Type  Visited With Patient  Visit Type Initial;Psychological support;Spiritual support;Behavioral Health  Referral From Nurse  Consult/Referral To Chaplain  Spiritual Encounters  Spiritual Needs Emotional  Stress Factors  Patient Stress Factors Exhausted;Family relationships;Health changes  Met w/patient to introduce myself and the role of chaplains. Pt. expressed feeling better and was engaging in conversation. Laughed as we traded jokes. I advised that he can contact me through the nurse if he would like to visit later. Chap. Estevan Kersh G. [place]

## 2015-07-09 NOTE — Progress Notes (Signed)
Recreation Therapy Notes  INPATIENT RECREATION THERAPY ASSESSMENT  Patient Details Name: Weyman CroonJared K Heuring MRN: [id number] DOB: October 02, 1985 Today's Date: 07/09/2015  Patient Stressors: Relationship, Work (Problems with wife, but things are getting better;)  Coping Skills:   Exercise, Music, Sports  Personal Challenges: Communication, Concentration, Expressing Yourself, Self-Esteem/Confidence, Stress Management, Time Management  Leisure Interests (2+):  Individual - Other (Comment) (Rock climbing, lifting)  Awareness of Community Resources:  Yes  Community Resources:  Park, Other (Comment) Building surveyor(Climbing gym)  Current Use: Yes  If no, Barriers?:    Patient Strengths:  Wellsite geologistGood leadership skills, dependable  Patient Identified Areas of Improvement:  Focus and organization  Current Recreation Participation:  Lifting  Patient Goal for Hospitalization:  TO identify triggers and come up with a plan to adjust them  [place]  County of Residence:  [place]   Current SI (including self-harm):  No  Current HI:  No  Consent to Intern Participation: N/A   Jacquelynn CreeGreene,Alyzae Hawkey M, LRT/CTRS 07/09/2015, 5:59 PM

## 2015-07-09 NOTE — [facility] Group Notes (Signed)
[facility] LCSW Group Therapy   07/09/2015 1:15 PM   Type of Therapy: Group Therapy   Participation Level: Did Not Attend. Patient invited to participate but declined.    Keyunna Coco F. Jonice Cerra, MSW, LCSWA, LCAS   

## 2015-07-09 NOTE — Progress Notes (Signed)
[facility]  07/09/2015 4:09 PM Hector CroonJared K Decker  MRN:  [id number]  Subjective:  Mr. Hector Decker is slightly better today but complains of severe anxiety. She is afraid to return home where he paces numerous problems. His wife has been coming to visit and has been supportive. He has only passing suicidal thoughts especially when having anxiety attacks. Sleep and appetite are fair. He has no somatic complaints. He tolerates medications well.  Principal Problem: <principal problem not specified> Diagnosis:   Patient Active Problem List   Diagnosis Date Noted  . Severe episode of recurrent major depressive disorder, without psychotic features (HCC) [F33.2]   . Major depression (HCC) [F32.9] 07/07/2015  . Depression, major, single episode, severe (HCC) [F32.2] 07/06/2015  . Suicidal ideation [R45.851] 07/06/2015  . Adjustment disorder with mixed disturbance of emotions and conduct [F43.25] 07/06/2015   Total Time spent with patient: 20 minutes  Past Psychiatric History: none.  Past Medical History:  Past Medical History  Diagnosis Date  . Depression    History reviewed. No pertinent past surgical history. Family History: History reviewed. No pertinent family history. Family Psychiatric  History: none. Social History:  History  Alcohol Use  . 3.6 oz/week  . 6 Cans of beer per week    Comment: 2-3 times a week     History  Drug Use  . Yes  . Special: Marijuana    Comment: once a month if that    Social History   Social History  . Marital Status: Single    Spouse Name: N/A  . Number of Children: N/A  . Years of Education: N/A   Social History Main Topics  . Smoking status: Never Smoker   . Smokeless tobacco: Never Used  . Alcohol Use: 3.6 oz/week    6 Cans of beer per week     Comment: 2-3 times a week  . Drug Use: Yes    Special: Marijuana     Comment: once a month if that  . Sexual Activity: Yes    Birth Control/ Protection: Pill   Other Topics Concern  .  None   Social History Narrative   Additional Social History:    History of alcohol / drug use?: No history of alcohol / drug abuse                    Sleep: Fair  Appetite:  Fair  Current Medications: Current Facility-Administered Medications  Medication Dose Route Frequency Provider Last Rate Last Dose  . acetaminophen (TYLENOL) tablet 650 mg  650 mg Oral Q6H PRN Audery AmelJohn T Clapacs, MD      . alum & mag hydroxide-simeth (MAALOX/MYLANTA) 200-200-20 MG/5ML suspension 30 mL  30 mL Oral Q4H PRN Audery AmelJohn T Clapacs, MD      . escitalopram (LEXAPRO) tablet 10 mg  10 mg Oral Daily Hector HaleUzma Faheem, MD   10 mg at 07/09/15 0857  . hydrOXYzine (ATARAX/VISTARIL) tablet 25 mg  25 mg Oral Q4H PRN Audery AmelJohn T Clapacs, MD   25 mg at 07/07/15 0032  . magnesium hydroxide (MILK OF MAGNESIA) suspension 30 mL  30 mL Oral Daily PRN Audery AmelJohn T Clapacs, MD      . naproxen (NAPROSYN) tablet 500 mg  500 mg Oral BID WC Crystal G Scarpena, RPH   500 mg at 07/09/15 0857  . traZODone (DESYREL) tablet 100 mg  100 mg Oral QHS Hector HaleUzma Faheem, MD   100 mg at 07/08/15 2115    Lab Results: No  results found for this or any previous visit (from the past 48 hour(s)).  Physical Findings: AIMS: Facial and Oral Movements Muscles of Facial Expression: None, normal Lips and Perioral Area: None, normal Jaw: None, normal Tongue: None, normal,Extremity Movements Upper (arms, wrists, hands, fingers): None, normal Lower (legs, knees, ankles, toes): None, normal, Trunk Movements Neck, shoulders, hips: None, normal, Overall Severity Severity of abnormal movements (highest score from questions above): None, normal Incapacitation due to abnormal movements: None, normal, Dental Status Current problems with teeth and/or dentures?: No Does patient usually wear dentures?: No  CIWA:    COWS:     Musculoskeletal: Strength & Muscle Tone: within normal limits Gait & Station: normal Patient leans: N/A  Psychiatric Specialty Exam: Review of  Systems  All other systems reviewed and are negative.   Blood pressure 131/83, pulse 71, temperature 98.1 F (36.7 C), temperature source Oral, resp. rate 18, height  (1.727 m), weight 82.555 kg (182 lb), SpO2 100 %.Body mass index is 27.68 kg/(m^2).  General Appearance: Casual  Eye Contact::  Fair  Speech:  Clear and Coherent  Volume:  Normal  Mood:  Hopeless  Affect:  Flat  Thought Process:  Goal Directed  Orientation:  Full (Time, Place, and Person)  Thought Content:  WDL  Suicidal Thoughts:  Yes.  with intent/plan  Homicidal Thoughts:  No  Memory:  Immediate;   Fair Recent;   Fair Remote;   Fair  Judgement:  Fair  Insight:  Fair  Psychomotor Activity:  Normal  Concentration:  Fair  Recall:  [employer] of Knowledge:Fair  Language: Fair  Akathisia:  No  Handed:  Right  AIMS (if indicated):     Assets:  Communication Skills Desire for Improvement Financial Resources/Insurance Housing Resilience Social Support Transportation Vocational/Educational  ADL's:  Intact  Cognition: WNL  Sleep:  Number of Hours: 7   Treatment Plan Summary: Daily contact with patient to assess and evaluate symptoms and progress in treatment and Medication management   Mr. Hector Decker is a 29 year old male with no past psychiatric history admitted after suicide attempt by hanging in the context of severe social stressors.  1. Suicidal ideation. The patient has passing thoughts of suicide during anxiety attacks. He is able to contract for safety in the hospital.  2. Mood. He was started on Lexapro for depression.  3. Anxiety. He has restarted available.  4. Insomnia. He is on trazodone.  5. Disposition. He will be discharged to home with his wife. He will continue with his therapist. He needs an appointment with a psychiatrist. Kristine Linea 07/09/2015, 4:09 PM

## 2015-07-09 NOTE — Progress Notes (Signed)
Recreation Therapy Notes  Date: 12.12.16 Time: 3:00 pm Location: Craft Room  Group Topic: Wellness  Goal Area(s) Addresses:  Patient will identify at least one item per dimension of health. Patient will examine areas they are deficient in.  Behavioral Response: Attentive, Interactive  Intervention: 6 Dimensions of Health  Activity: Patients were given a worksheet with the definitions of the 6 Dimension of Health and examples of each dimension. Patients were given a worksheet with the 6 dimensions on it and instructed to list 2-3 things in each dimension.  Education: LRT educated patients on how this activity was related to their admission and d/c from the hospital.  Education Outcome: Acknowledges education/In group clarification offered  Clinical Observations/Feedback: Patient completed activity by writing 2 items per each dimension. Patient contributed to group discussion by stating what areas he was giving enough attention to, what ares he was not giving enough attention to, what things he can do to improve areas, and how this activity relates to his admission.  Jacquelynn CreeGreene,Kimra Kantor M, LRT/CTRS 07/09/2015 4:56 PM

## 2015-07-09 NOTE — Progress Notes (Signed)
Patient is pleasant & cooperative.Denies suicidal and homicidal ideation.Appropriate with staff & peers.Compliant with medications.Attended groups.Appetite good.

## 2015-07-09 NOTE — [facility] Group Notes (Signed)
[facility] Group Notes:  (Nursing/MHT/Case Management/Adjunct)  Date:  07/09/2015  Time:  2:00 PM  Type of Therapy:  Psychoeducational Skills  Participation Level:  Did Not Attend   Hector Decker 07/09/2015, 2:00 PM 

## 2015-07-09 NOTE — [facility] Group Notes (Addendum)
[facility]   07/09/2015 2:37 PM  Participation Quality:  Engaged  Mood/Affect:  Appropriate  Depression Rating:  3  Anxiety Rating:  2  Thoughts of Suicide:  No Will you contract for safety?   Yes  Current AVH:  No  Plan for Discharge/Comments:  Pt plans to return home and seek outpatient services  Transportation Means: Pt will be picked up by family  Supports: Pt lists his family as his primary support  [name] Decker LCSWA, LCAS  07/09/15

## 2015-07-09 NOTE — Plan of Care (Signed)
Problem: Diagnosis: Increased Risk For Suicide Attempt Goal: LTG-Patient Will Report Improved Mood and Deny Suicidal LTG (by discharge) Patient will report improved mood and deny suicidal ideation.  Outcome: Progressing Denies suicidal ideation.     

## 2015-07-10 MED ORDER — HYDROXYZINE HCL 25 MG PO TABS: 25.0000 mg | ORAL_TABLET | Freq: Three times a day (TID) | ORAL | Status: DC | PRN

## 2015-07-10 MED ORDER — TRAZODONE HCL 100 MG PO TABS: 100.0000 mg | ORAL_TABLET | Freq: Every day | ORAL | Status: DC

## 2015-07-10 MED ORDER — ESCITALOPRAM OXALATE 20 MG PO TABS: 20.0000 mg | ORAL_TABLET | Freq: Every day | ORAL | Status: DC

## 2015-07-10 MED ORDER — ESCITALOPRAM OXALATE 10 MG PO TABS
20.0000 mg | ORAL_TABLET | Freq: Every day | ORAL | Status: DC
  Administered 2015-07-10: 20 mg via ORAL

## 2015-07-10 NOTE — Progress Notes (Signed)
D/C instructions/meds/follow-up appointments reviewed, pt verbalized understanding, pt's belongings returned to pt, denies SI/HI/AVH. 

## 2015-07-10 NOTE — Progress Notes (Signed)
  [facility] :  Will you be returning to the same living situation after discharge:  Yes,   Home with wife At discharge, do you have transportation home?: Yes,   wife will pick up Do you have the ability to pay for your medications: Yes,   [employer] insurance  Release of information consent forms completed and in the chart;  Patient's signature needed at discharge.  Patient to Follow up at: Follow-up Information    Go to [facility].   Why:  Hospital Follow up, Outpatient Medication Management, Walk-ins Monday-Friday between 9am-4pm., Please take your photo ID and Insurance card   Contact information:   [address] [place] [address] [phone] Fax-[phone]      Schedule an appointment as soon as possible for a visit with [facility].   Why:  Therapy   Contact information:   [address] [place] [address] [phone]       Next level of care provider [facility] Link:no  Patient denies SI/HI: Yes,       Safety Planning and Suicide Prevention discussed: Yes,     Have you used any form of tobacco in the last 30 days? (Cigarettes, Smokeless Tobacco, Cigars, and/or Pipes): No  Has patient been referred to the Quitline?: N/A patient is not a smoker  Patient has been referred for addiction treatment: N/A  Jonathyn Carothers, Cleda DaubSara P, MSW, LCSW 07/10/2015, 11:32 AM

## 2015-07-10 NOTE — Plan of Care (Signed)
Problem: [facility] Participation in Recreation Therapeutic Interventions Goal: STG-Patient will demonstrate improved self esteem by identif STG: Self-Esteem - Within 3 treatment sessions, patient will verbalize at least 5 positive affirmation statements in one treatment session to increase self-esteem post d/c.  Outcome: Completed/Met Date Met:  07/10/15 Treatment Session 1; Completed 1 out of 1: At approximately 10:45 am, LRT met with patient in craft room. Patient verbalized 5 positive affirmation statements. Patient reported it felt "good". LRT encouraged patient to continue saying positive affirmation statements. Intervention Used: I Am statements  Leonette Monarch, LRT/CTRS 12.13.16 12:57 pm Goal: STG-Other Recreation Therapy Goal (Specify) STG: Stress Management - Within 3 treatment sessions, patient will verbalize understanding of the stress management techniques in one treatment session to increase stress management skills post d/c.  Outcome: Completed/Met Date Met:  07/10/15 Treatment Session 1; Completed 1 out of 1: At approximately 10:45 am, LRT met with patient in craft room. LRT educated and provided patient with handouts on stress management techniques. Patient verbalized understanding. LRT encouraged patient to read over and practice the stress management techniques. Intervention Used: Stress Management handouts  Leonette Monarch, LRT/CTRS 12.13.16 12:58 pm  Problem: [facility] Participation in Recreation Therapeutic Interventions Goal: STG-Other Recreation Therapy Goal (Specify) STG: Time Management - Within 3 treatment sessions, patient will verbalize understanding of the schedules in one treatment session to increase time management skills post d/c.  Outcome: Completed/Met Date Met:  07/10/15 Treatment Session 1; Completed 1 out of 1: At approximately 10:45 am, LRT met with patient in craft room. LRT educated and provided patient with blank schedules. Patient verbalized understanding.  LRT encouraged patient to use the schedules to help him manage his time better. Intervention Used: Schedules  Leonette Monarch, LRT/CTRS 12.13.16 1:00 pm  Problem: [facility] Participation in Recreation Therapeutic Interventions Goal: STG-Other Recreation Therapy Goal (Specify) STG: Self-Expression - Within 3 treatment sessions, patient will verbalize understanding of different forms of self-expression in one treatment session to increase self-expression skills post d/c.  Outcome: Completed/Met Date Met:  07/10/15 Treatment Session 1; Completed 1 out of 1: At approximately 10:45 am, LRT met with patient in craft room. LRT educated patient on different forms of self-expression. Patient verbalized understanding. LRT encouraged patient to use forms of self-expression to help him express his emotions. Intervention Used: Self-expression forms  Leonette Monarch, LRT/CTRS 12.13.16 1:01 pm

## 2015-07-10 NOTE — Plan of Care (Signed)
Problem: Ineffective individual coping Goal: STG: Patient will remain free from self harm Outcome: Progressing Medications administered as ordered by the physician, medications Therapeutic Effects, SEs and Adverse effects discussed, questions encouraged; no PRN given, 15 minute checks maintained for safety, clinical and moral support provided, patient encouraged to continue to express feelings and demonstrate safe care. Patient remain free from harm, will continue to monitor.         

## 2015-07-10 NOTE — Progress Notes (Signed)
Recreation Therapy Notes  INPATIENT RECREATION TR PLAN  Patient Details Name: GRANTLAND WANT MRN: [id number] DOB: 1986-02-25 Today's Date: 07/10/2015  Rec Therapy Plan Is patient appropriate for Therapeutic Recreation?: Yes Treatment times per week: See Care Plan TR Treatment/Interventions: 1:1 session, Group participation (Comment) (Appropriate participation in daily recreation therapy tx)  Discharge Criteria Pt will be discharged from therapy if:: Treatment goals are met, Discharged Treatment plan/goals/alternatives discussed and agreed upon by:: Patient/family  Discharge Summary Short term goals set: See Care Plan Short term goals met: Complete Progress toward goals comments: One-to-one attended Which groups?: Wellness One-to-one attended: Self-esteem, stress management, time management, self-expression Reason goals not met: N/A Therapeutic equipment acquired: None Reason patient discharged from therapy: Discharge from hospital Pt/family agrees with progress & goals achieved: Yes Date patient discharged from therapy: 07/10/15   Leonette Monarch, LRT/CTRS 07/10/2015, 1:02 PM

## 2015-07-10 NOTE — [facility] Suicide Risk Assessment (Signed)
[facility]   Demographic Factors:  Male and Caucasian  Total Time spent with patient: 30 minutes  Musculoskeletal: Strength & Muscle Tone: within normal limits Gait & Station: normal Patient leans: N/A  Psychiatric Specialty Exam: Physical Exam  Nursing note and vitals reviewed.   Review of Systems  All other systems reviewed and are negative.   Blood pressure 131/83, pulse 71, temperature 98.1 F (36.7 C), temperature source Oral, resp. rate 18, height 5\' 8"  (1.727 m), weight 82.555 kg (182 lb), SpO2 100 %.Body mass index is 27.68 kg/(m^2).  General Appearance: Casual  Eye Contact::  Good  Speech:  Clear and Coherent409  Volume:  Normal  Mood:  Euthymic  Affect:  Appropriate  Thought Process:  Goal Directed  Orientation:  Full (Time, Place, and Person)  Thought Content:  WDL  Suicidal Thoughts:  No  Homicidal Thoughts:  No  Memory:  Immediate;   Fair Recent;   Fair Remote;   Fair  Judgement:  Fair  Insight:  Fair  Psychomotor Activity:  Normal  Concentration:  Fair  Recall:  [employer]  Fund of Knowledge:Fair  Language: Fair  Akathisia:  No  Handed:  Right  AIMS (if indicated):     Assets:  Communication Skills Desire for Improvement Financial Resources/Insurance Housing Physical Health Resilience Social Support  Sleep:  Number of Hours: 6  Cognition: WNL  ADL's:  Intact   Have you used any form of tobacco in the last 30 days? (Cigarettes, Smokeless Tobacco, Cigars, and/or Pipes): No  Has this patient used any form of tobacco in the last 30 days? (Cigarettes, Smokeless Tobacco, Cigars, and/or Pipes) No  Mental Status Per Nursing Assessment::   On Admission:  NA  Current Mental Status by Physician: NA  Loss Factors: NA  Historical Factors: Impulsivity  Risk Reduction Factors:   Sense of responsibility to family, Employed, Living with another person, especially a relative, Positive social support and Positive therapeutic  relationship  Continued Clinical Symptoms:  Depression:   Insomnia  Cognitive Features That Contribute To Risk:  None    Suicide Risk:  Minimal: No identifiable suicidal ideation.  Patients presenting with no risk factors but with morbid ruminations; may be classified as minimal risk based on the severity of the depressive symptoms  Principal Problem: Adjustment disorder with mixed disturbance of emotions and conduct Discharge Diagnoses:  Patient Active Problem List   Diagnosis Date Noted  . Suicidal ideation [R45.851] 07/06/2015  . Adjustment disorder with mixed disturbance of emotions and conduct [F43.25] 07/06/2015      Plan Of Care/Follow-up recommendations:  Activity:  As tolerated. Diet:  Regular. Other:  Keep follow-up appointments.  Is patient on multiple antipsychotic therapies at discharge:  No   Has Patient had three or more failed trials of antipsychotic monotherapy by history:  No  Recommended Plan for Multiple Antipsychotic Therapies: NA    Jolanta Pucilowska 07/10/2015, 9:35 AM

## 2015-07-10 NOTE — Discharge Summary (Signed)
Physician Discharge Summary Note  Patient:  Hector Decker is an 29 y.o., male MRN:  [id number] DOB:  08/04/1985 Patient phone:  [phone] (home)  Patient address:   [address]  [place] [address],  Total Time spent with patient: 30 minutes  Date of Admission:  07/06/2015 Date of Discharge: 07/10/2015  Reason for Admission:  Suicide attempt.  Patient is a 29 year old male who was admitted from the emergency room. Most of the history was obtained from the patient as well as review of the chart. According to the patient he tried to hang himself on Wednesday using some rock climbing apparatus at home. He was found by his wife who walked up on him and then stopped him and he was brought to the hospital. Patient reported that she became hysterical and upset with the situation. Patient reported that he has been feeling depressed for the past one month after his wife found out about his affair. He reported that he was having an affair with a coworker at work. He reported that he kept lying about it but then his wife found out about her to his email and messages. 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 The 2 of them of been going to marital counseling since March and has seen 3 different counselors but he reported that he did not notice an improvement in his symptoms. Patient reported that he feels that he has 2 different personalities and he is very outgoing and social in certain situations but when he is alone he feels very depressed sad and feels guilty.  Patient reported that he has never used any drugs or alcohol he stated that he has been married for the past 2 years but was with his wife for the past 5 years.  Patient is not taking any psychotropic medications at this time. He reported that he feels guilty and wants help with his addiction as well as with his depression.   Social  history: Patient is married for the last 2 years. He and his wife been together for 5 years. No children. 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  Medical history: Patient denies any significant medical problems. Not on any current medication. No history of heart disease high blood pressure or diabetes.  Substance abuse history: Patient says that his drinking has never been a problem and he does not use other drugs. Denies any substance abuse problems in the past.  Past Psychiatric History: Patient states that he made a suicide attempt at age 75 when his mother discovered that he was using pornography. He has never been in a psychiatric hospital. Never been on any psychiatric medicine. He has been seeing a marital counselor with his wife for at least a few months.  Associated Signs/Symptoms: Depression Symptoms: depressed mood, psychomotor retardation, fatigue, hopelessness, suicidal attempt, anxiety, loss of energy/fatigue, (Hypo) Manic Symptoms: Impulsivity, Irritable Mood, Anxiety Symptoms: Excessive Worry, Psychotic Symptoms: none PTSD Symptoms: Negative.  Principal Problem: Adjustment disorder with mixed disturbance of emotions and conduct Discharge Diagnoses: Patient Active Problem List   Diagnosis Date Noted  . Suicidal ideation [R45.851] 07/06/2015  . Adjustment disorder with mixed disturbance of emotions and conduct [F43.25] 07/06/2015    Past Psychiatric History: None.  Past Medical History:  Past Medical History  Diagnosis Date  . Depression    History reviewed. No pertinent past surgical history. Family History: History reviewed. No pertinent family  history. Family Psychiatric  History: None Social History:  History  Alcohol Use  . 3.6 oz/week  . 6 Cans of beer per week    Comment: 2-3 times a week     History  Drug Use  . Yes  . Special: Marijuana    Comment: once a month if that    Social History   Social History  . Marital Status: Single     Spouse Name: N/A  . Number of Children: N/A  . Years of Education: N/A   Social History Main Topics  . Smoking status: Never Smoker   . Smokeless tobacco: Never Used  . Alcohol Use: 3.6 oz/week    6 Cans of beer per week     Comment: 2-3 times a week  . Drug Use: Yes    Special: Marijuana     Comment: once a month if that  . Sexual Activity: Yes    Birth Control/ Protection: Pill   Other Topics Concern  . None   Social History Narrative    Hospital Course:    Hector Decker is a 29 year old male with no past psychiatric history admitted after suicide attempt by hanging in the context of severe social stressors.  1. Suicidal ideation. This has resolved. The patient is able to contract for safety.   2. Mood. He was started on Lexapro for depression.  3. Anxiety. He responded well to Vistaril.   4. Insomnia. He was offered trazodone.  5. Disposition. He was discharged to home with his wife. He will continue with his therapist for marital counselling. He will follow up with a psychiatrist for medication management.  Physical Findings: AIMS: Facial and Oral Movements Muscles of Facial Expression: None, normal Lips and Perioral Area: None, normal Jaw: None, normal Tongue: None, normal,Extremity Movements Upper (arms, wrists, hands, fingers): None, normal Lower (legs, knees, ankles, toes): None, normal, Trunk Movements Neck, shoulders, hips: None, normal, Overall Severity Severity of abnormal movements (highest score from questions above): None, normal Incapacitation due to abnormal movements: None, normal, Dental Status Current problems with teeth and/or dentures?: No Does patient usually wear dentures?: No  CIWA:    COWS:     Musculoskeletal: Strength & Muscle Tone: within normal limits Gait & Station: normal Patient leans: N/A  Psychiatric Specialty Exam: Review of Systems  All other systems reviewed and are negative.   Blood pressure 131/83, pulse 71, temperature  98.1 F (36.7 C), temperature source Oral, resp. rate 18, height 5\' 8"  (1.727 m), weight 82.555 kg (182 lb), SpO2 100 %.Body mass index is 27.68 kg/(m^2).  See SRA.                                                  Sleep:  Number of Hours: 6   Have you used any form of tobacco in the last 30 days? (Cigarettes, Smokeless Tobacco, Cigars, and/or Pipes): No  Has this patient used any form of tobacco in the last 30 days? (Cigarettes, Smokeless Tobacco, Cigars, and/or Pipes) Yes, No  Metabolic Disorder Labs:  No results found for: HGBA1C, MPG No results found for: PROLACTIN Lab Results  Component Value Date   CHOL 135 07/07/2015   TRIG 105 07/07/2015   HDL 44 07/07/2015   CHOLHDL 3.1 07/07/2015   VLDL 21 07/07/2015   LDLCALC 70 07/07/2015    See Psychiatric  Specialty Exam and Suicide Risk Assessment completed by Attending Physician prior to discharge.  Discharge destination:  Home  Is patient on multiple antipsychotic therapies at discharge:  No   Has Patient had three or more failed trials of antipsychotic monotherapy by history:  No  Recommended Plan for Multiple Antipsychotic Therapies: NA  Discharge Instructions    Diet - low sodium heart healthy    Complete by:  As directed      Increase activity slowly    Complete by:  As directed             Medication List    TAKE these medications      Indication   escitalopram 20 MG tablet  Commonly known as:  LEXAPRO  Take 1 tablet (20 mg total) by mouth daily.   Indication:  Depression     hydrOXYzine 25 MG tablet  Commonly known as:  ATARAX/VISTARIL  Take 1 tablet (25 mg total) by mouth every 8 (eight) hours as needed for anxiety.   Indication:  Anxiety Neurosis     naproxen 500 MG tablet  Commonly known as:  NAPROSYN  Take 500 mg by mouth 2 (two) times daily with a meal.      traZODone 100 MG tablet  Commonly known as:  DESYREL  Take 1 tablet (100 mg total) by mouth at bedtime.   Indication:   Trouble Sleeping         Follow-up recommendations:  Activity:  As tolerated.  Diet:  Regular. Other:  Keep follow-up appointments.  Comments:    Signed: Iona Stay 07/10/2015, 9:38 AM

## 2015-07-10 NOTE — Progress Notes (Signed)
Quiet, A&Ox3, interacting well with peers and staffs; no behavioral problems, well behaved.

## 2015-07-16 DIAGNOSIS — F324 Major depressive disorder, single episode, in partial remission: Secondary | ICD-10-CM | POA: Insufficient documentation

## 2015-07-16 DIAGNOSIS — S39012A Strain of muscle, fascia and tendon of lower back, initial encounter: Secondary | ICD-10-CM | POA: Insufficient documentation

## 2015-09-07 ENCOUNTER — Emergency Department: Payer: Commercial Managed Care - PPO

## 2015-09-07 ENCOUNTER — Emergency Department
Admission: EM | Admit: 2015-09-07 | Discharge: 2015-09-07 | Disposition: A | Discharge status: Home / Self Care | Payer: Commercial Managed Care - PPO | Attending: Emergency Medicine | Admitting: Emergency Medicine

## 2015-09-07 DIAGNOSIS — Z79899 Other long term (current) drug therapy: Secondary | ICD-10-CM | POA: Diagnosis not present

## 2015-09-07 DIAGNOSIS — N200 Calculus of kidney: Secondary | ICD-10-CM

## 2015-09-07 DIAGNOSIS — Z791 Long term (current) use of non-steroidal anti-inflammatories (NSAID): Secondary | ICD-10-CM | POA: Diagnosis not present

## 2015-09-07 DIAGNOSIS — R1110 Vomiting, unspecified: Secondary | ICD-10-CM | POA: Diagnosis present

## 2015-09-07 LAB — CBC
HCT: 45.5 % (ref 40.0–52.0)
Hemoglobin: 15.4 g/dL (ref 13.0–18.0)
MCH: 30.2 pg (ref 26.0–34.0)
MCHC: 33.9 g/dL (ref 32.0–36.0)
MCV: 89.1 fL (ref 80.0–100.0)
PLATELETS: 223 10*3/uL (ref 150–440)
RBC: 5.11 MIL/uL (ref 4.40–5.90)
RDW: 13.1 % (ref 11.5–14.5)
WBC: 9.7 10*3/uL (ref 3.8–10.6)

## 2015-09-07 LAB — COMPREHENSIVE METABOLIC PANEL
ALBUMIN: 4.1 g/dL (ref 3.5–5.0)
ALT: 21 U/L (ref 17–63)
ANION GAP: 7 (ref 5–15)
AST: 24 U/L (ref 15–41)
Alkaline Phosphatase: 106 U/L (ref 38–126)
BILIRUBIN TOTAL: 0.4 mg/dL (ref 0.3–1.2)
BUN: 14 mg/dL (ref 6–20)
CO2: 28 mmol/L (ref 22–32)
CREATININE: 1.23 mg/dL (ref 0.61–1.24)
Calcium: 9.2 mg/dL (ref 8.9–10.3)
Chloride: 106 mmol/L (ref 101–111)
GFR calc Af Amer: >60 mL/min (ref >60)
GFR calc non Af Amer: >60 mL/min (ref >60)
Glucose, Bld: 147 mg/dL — ABNORMAL HIGH (ref 65–99)
POTASSIUM: 4 mmol/L (ref 3.5–5.1)
Sodium: 141 mmol/L (ref 135–145)
TOTAL PROTEIN: 6.8 g/dL (ref 6.5–8.1)

## 2015-09-07 LAB — LIPASE, BLOOD: LIPASE: 30 U/L (ref 11–51)

## 2015-09-07 MED ORDER — OXYCODONE-ACETAMINOPHEN 5-325 MG PO TABS
1.0000 | ORAL_TABLET | Freq: Once | ORAL | Status: AC
  Administered 2015-09-07: 1 via ORAL
  Filled 2015-09-07: qty 1

## 2015-09-07 MED ORDER — SODIUM CHLORIDE 0.9 % IV BOLUS (SEPSIS)
1000.0000 mL | Freq: Once | INTRAVENOUS | Status: AC
  Administered 2015-09-07: 1000 mL via INTRAVENOUS

## 2015-09-07 MED ORDER — ONDANSETRON 4 MG PO TBDP: 4.0000 mg | ORAL_TABLET | Freq: Three times a day (TID) | ORAL | Status: DC | PRN

## 2015-09-07 MED ORDER — ONDANSETRON HCL 4 MG/2ML IJ SOLN
4.0000 mg | Freq: Once | INTRAMUSCULAR | Status: AC
  Administered 2015-09-07: 4 mg via INTRAVENOUS

## 2015-09-07 MED ORDER — OXYCODONE-ACETAMINOPHEN 5-325 MG PO TABS: 1.0000 | ORAL_TABLET | ORAL | Status: DC | PRN

## 2015-09-07 MED ORDER — KETOROLAC TROMETHAMINE 30 MG/ML IJ SOLN
30.0000 mg | Freq: Once | INTRAMUSCULAR | Status: AC
  Administered 2015-09-07: 30 mg via INTRAVENOUS
  Filled 2015-09-07: qty 1

## 2015-09-07 MED ORDER — MORPHINE SULFATE (PF) 4 MG/ML IV SOLN
INTRAVENOUS | Status: AC
  Administered 2015-09-07: 4 mg via INTRAVENOUS
  Filled 2015-09-07: qty 1

## 2015-09-07 MED ORDER — TAMSULOSIN HCL 0.4 MG PO CAPS: 0.4000 mg | ORAL_CAPSULE | Freq: Every day | ORAL | Status: AC

## 2015-09-07 MED ORDER — ONDANSETRON HCL 4 MG/2ML IJ SOLN
INTRAMUSCULAR | Status: AC
  Administered 2015-09-07: 4 mg via INTRAVENOUS
  Filled 2015-09-07: qty 2

## 2015-09-07 MED ORDER — TAMSULOSIN HCL 0.4 MG PO CAPS
0.4000 mg | ORAL_CAPSULE | Freq: Once | ORAL | Status: AC
Start: 2015-09-07 — End: 2015-09-07
  Administered 2015-09-07: 0.4 mg via ORAL
  Filled 2015-09-07: qty 1

## 2015-09-07 MED ORDER — MORPHINE SULFATE (PF) 4 MG/ML IV SOLN
4.0000 mg | Freq: Once | INTRAVENOUS | Status: AC
  Administered 2015-09-07: 4 mg via INTRAVENOUS

## 2015-09-07 MED ORDER — MORPHINE SULFATE (PF) 4 MG/ML IV SOLN
4.0000 mg | Freq: Once | INTRAVENOUS | Status: AC
  Administered 2015-09-07: 4 mg via INTRAVENOUS
  Filled 2015-09-07: qty 1

## 2015-09-07 NOTE — Discharge Instructions (Signed)
Kidney Stones °Kidney stones (urolithiasis) are deposits that form inside your kidneys. The intense pain is caused by the stone moving through the urinary tract. When the stone moves, the ureter goes into spasm around the stone. The stone is usually passed in the urine.  °CAUSES  °· A disorder that makes certain neck glands produce too much parathyroid hormone (primary hyperparathyroidism). °· A buildup of uric acid crystals, similar to gout in your joints. °· Narrowing (stricture) of the ureter. °· A kidney obstruction present at birth (congenital obstruction). °· Previous surgery on the kidney or ureters. °· Numerous kidney infections. °SYMPTOMS  °· Feeling sick to your stomach (nauseous). °· Throwing up (vomiting). °· Blood in the urine (hematuria). °· Pain that usually spreads (radiates) to the groin. °· Frequency or urgency of urination. °DIAGNOSIS  °· Taking a history and physical exam. °· Blood or urine tests. °· CT scan. °· Occasionally, an examination of the inside of the urinary bladder (cystoscopy) is performed. °TREATMENT  °· Observation. °· Increasing your fluid intake. °· Extracorporeal shock wave lithotripsy--This is a noninvasive procedure that uses shock waves to break up kidney stones. °· Surgery may be needed if you have severe pain or persistent obstruction. There are various surgical procedures. Most of the procedures are performed with the use of small instruments. Only small incisions are needed to accommodate these instruments, so recovery time is minimized. °The size, location, and chemical composition are all important variables that will determine the proper choice of action for you. Talk to your health care provider to better understand your situation so that you will minimize the risk of injury to yourself and your kidney.  °HOME CARE INSTRUCTIONS  °· Drink enough water and fluids to keep your urine clear or pale yellow. This will help you to pass the stone or stone fragments. °· Strain  all urine through the provided strainer. Keep all particulate matter and stones for your health care provider to see. The stone causing the pain may be as small as a grain of salt. It is very important to use the strainer each and every time you pass your urine. The collection of your stone will allow your health care provider to analyze it and verify that a stone has actually passed. The stone analysis will often identify what you can do to reduce the incidence of recurrences. °· Only take over-the-counter or prescription medicines for pain, discomfort, or fever as directed by your health care provider. °· Keep all follow-up visits as told by your health care provider. This is important. °· Get follow-up X-rays if required. The absence of pain does not always mean that the stone has passed. It may have only stopped moving. If the urine remains completely obstructed, it can cause loss of kidney function or even complete destruction of the kidney. It is your responsibility to make sure X-rays and follow-ups are completed. Ultrasounds of the kidney can show blockages and the status of the kidney. Ultrasounds are not associated with any radiation and can be performed easily in a matter of minutes. °· Make changes to your daily diet as told by your health care provider. You may be told to: °¨ Limit the amount of salt that you eat. °¨ Eat 5 or more servings of fruits and vegetables each day. °¨ Limit the amount of meat, poultry, fish, and eggs that you eat. °· Collect a 24-hour urine sample as told by your health care provider. You may need to collect another urine sample every 6-12   months. °SEEK MEDICAL CARE IF: °· You experience pain that is progressive and unresponsive to any pain medicine you have been prescribed. °SEEK IMMEDIATE MEDICAL CARE IF:  °· Pain cannot be controlled with the prescribed medicine. °· You have a fever or shaking chills. °· The severity or intensity of pain increases over 18 hours and is not  relieved by pain medicine. °· You develop a new onset of abdominal pain. °· You feel faint or pass out. °· You are unable to urinate. °  °This information is not intended to replace advice given to you by your health care provider. Make sure you discuss any questions you have with your health care provider. °  °Document Released: 07/14/2005 Document Revised: 04/04/2015 Document Reviewed: 12/15/2012 °Elsevier Interactive Patient Education ©2016 [employer] ° °

## 2015-09-07 NOTE — ED Notes (Signed)
Pt returned from CT. Pt actively vomiting, Dr. Manson Passey notified.

## 2015-09-07 NOTE — ED Provider Notes (Signed)
[facility] Emergency Department Provider Note  ____________________________________________  Time seen: 4:30 AM  I have reviewed the triage vital signs and the nursing notes.   HISTORY  Chief Complaint Emesis     HPI Hector Decker is a 30 y.o. male presents with 10 out of 10 left sided abdominal/flank pain, and vomiting acute onset tonight. Of note patient had a colonoscopy performed yesterday for fecal incontinence      Past Medical History  Diagnosis Date  . Depression     Patient Active Problem List   Diagnosis Date Noted  . Suicidal ideation 07/06/2015  . Adjustment disorder with mixed disturbance of emotions and conduct 07/06/2015    No past surgical history on file.  Current Outpatient Rx  Name  Route  Sig  Dispense  Refill  . escitalopram (LEXAPRO) 20 MG tablet   Oral   Take 1 tablet (20 mg total) by mouth daily.   30 tablet   0   . hydrOXYzine (ATARAX/VISTARIL) 25 MG tablet   Oral   Take 1 tablet (25 mg total) by mouth every 8 (eight) hours as needed for anxiety.   90 tablet   0   . naproxen (NAPROSYN) 500 MG tablet   Oral   Take 500 mg by mouth 2 (two) times daily with a meal.         . traZODone (DESYREL) 100 MG tablet   Oral   Take 1 tablet (100 mg total) by mouth at bedtime.   30 tablet   0     Allergies No known Alergies  No family history on file.  Social History Social History  Substance Use Topics  . Smoking status: Never Smoker   . Smokeless tobacco: Never Used  . Alcohol Use: 3.6 oz/week    6 Cans of beer per week     Comment: 2-3 times a week    Review of Systems  Constitutional: Negative for fever. Eyes: Negative for visual changes. ENT: Negative for sore throat. Cardiovascular: Negative for chest pain. Respiratory: Negative for shortness of breath. Gastrointestinal: Positive for left flank pain, vomiting Genitourinary: Negative for dysuria. Musculoskeletal: Negative for back  pain. Skin: Negative for rash. Neurological: Negative for headaches, focal weakness or numbness.   10-point ROS otherwise negative.  ____________________________________________   PHYSICAL EXAM:  VITAL SIGNS: ED Triage Vitals  Enc Vitals Group     BP 09/07/15 0410 196/161 mmHg     Pulse Rate 09/07/15 0410 80     Resp 09/07/15 0410 18     Temp --      Temp src --      SpO2 09/07/15 0410 100 %     Weight 09/07/15 0410 190 lb (86.183 kg)     Height 09/07/15 0410  (1.727 m)     Head Cir --      Peak Flow --      Pain Score 09/07/15 0410 10     Pain Loc --      Pain Edu? --      Excl. in GC? --      Constitutional: Alert and oriented. Apparent distress Eyes: Conjunctivae are normal. PERRL. Normal extraocular movements. ENT   Head: Normocephalic and atraumatic.   Nose: No congestion/rhinnorhea.   Mouth/Throat: Mucous membranes are moist.   Neck: No stridor. Hematological/Lymphatic/Immunilogical: No cervical lymphadenopathy. Cardiovascular: Normal rate, regular rhythm. Normal and symmetric distal pulses are present in all extremities. No murmurs, rubs, or gallops. Respiratory: Normal respiratory effort  without tachypnea nor retractions. Breath sounds are clear and equal bilaterally. No wheezes/rales/rhonchi. Gastrointestinal: Soft and nontender. No distention. There is no CVA tenderness. Genitourinary: deferred Musculoskeletal: Nontender with normal range of motion in all extremities. No joint effusions.  No lower extremity tenderness nor edema. Neurologic:  Normal speech and language. No gross focal neurologic deficits are appreciated. Speech is normal.  Skin:  Skin is warm, dry and intact. No rash noted. Psychiatric: Mood and affect are normal. Speech and behavior are normal. Patient exhibits appropriate insight and judgment.  ____________________________________________    LABS (pertinent positives/negatives)  Labs Reviewed  COMPREHENSIVE METABOLIC  PANEL - Abnormal; Notable for the following:    Glucose, Bld 147 (*)    All other components within normal limits  LIPASE, BLOOD  CBC  URINALYSIS COMPLETEWITH MICROSCOPIC ([facility] ONLY)      RADIOLOGY  CT Renal Stone Study (Final result) Result time: 09/07/15 05:07:50   Final result by Rad Results In Interface (09/07/15 05:07:50)   Narrative:   CLINICAL DATA: Acute onset of mid back pain and vomiting. Initial encounter.  EXAM: CT ABDOMEN AND PELVIS WITHOUT CONTRAST  TECHNIQUE: Multidetector CT imaging of the abdomen and pelvis was performed following the standard protocol without IV contrast.  COMPARISON: None.  FINDINGS: The visualized lung bases are clear.  The liver and spleen are unremarkable in appearance. The gallbladder is within normal limits. The pancreas and adrenal glands are unremarkable.  There is minimal left-sided hydronephrosis, with prominence of the left ureter along its entire course, and an obstructing 4 mm stone noted at the distal left ureter, just above the left vesicoureteral junction. Mild soft tissue inflammation is noted about the left renal pelvis. The right kidney is unremarkable in appearance. No nonobstructing renal stones are seen.  Trace fluid within the pelvis is nonspecific. The small bowel is unremarkable in appearance. The stomach is within normal limits. No acute vascular abnormalities are seen.  The appendix is normal in caliber, without evidence of appendicitis. The colon is unremarkable in appearance.  The bladder is mildly distended and grossly unremarkable. The prostate remains normal in size. No inguinal lymphadenopathy is seen.  No acute osseous abnormalities are identified.  IMPRESSION: Minimal left-sided hydronephrosis, with an obstructing 4 mm stone noted at the distal left ureter, just above the left vesicoureteral junction.   Electronically Signed By: Roanna Raider M.D. On: 09/07/2015 05:07        INITIAL IMPRESSION / ASSESSMENT AND PLAN / ED COURSE  Pertinent labs & imaging results that were available during my care of the patient were reviewed by me and considered in my medical decision making (see chart for details).  She received IV morphine 4 mg and Zofran 4 mg without relief of nausea or pain as such patient received additional dose of morphine and Toradol following CAT scan with much improved pain. CT scan consistent with a 4 mm left distal ureter stones  ____________________________________________   FINAL CLINICAL IMPRESSION(S) / ED DIAGNOSES  Final diagnoses:  Kidney stone on left side      Darci Current, MD 09/07/15 [id number]

## 2015-09-07 NOTE — ED Notes (Signed)
Pt in with co mid back pain that woke him up with vomiting.  Had colonoscopy yest, denies any abd pain.

## 2015-09-07 NOTE — ED Notes (Signed)
Pt went to CT

## 2017-06-02 IMAGING — CT CT RENAL STONE PROTOCOL
1 of 2 series · 5 of 32 positions shown, 10 images · non-contrast
Comparison: None.

CLINICAL DATA: Acute onset of mid back pain and vomiting. Initial
encounter.

EXAM:
CT ABDOMEN AND PELVIS WITHOUT CONTRAST
TECHNIQUE: Multidetector CT imaging of the abdomen and pelvis was performed
following the standard protocol without IV contrast.

[Series 4: lung windows · axial · 0.70mm/px · z∈[-669,-574]mm · 5 of 29 slices shown, 10 images]
[im 5/29  soft-tissue]
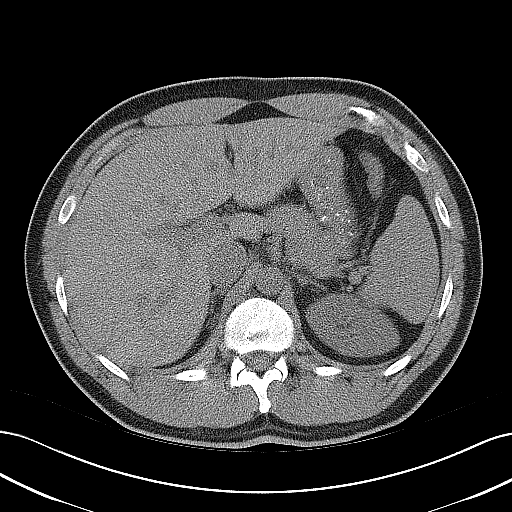
[im 5/29  bone]
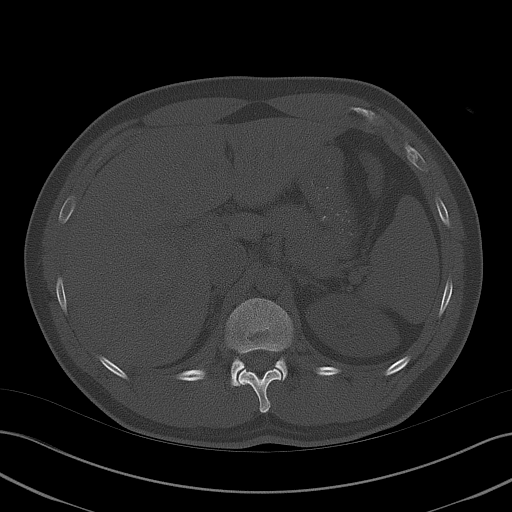
[im 10/29  soft-tissue]
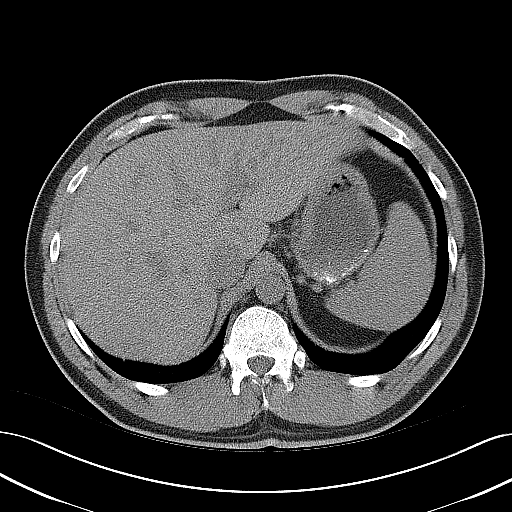
[im 10/29  lung]
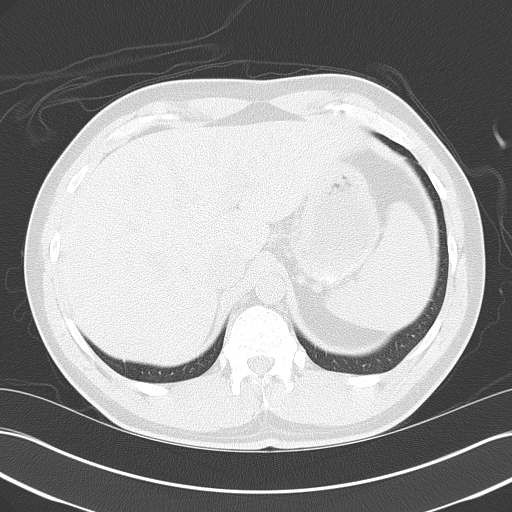
[im 15/29  soft-tissue]
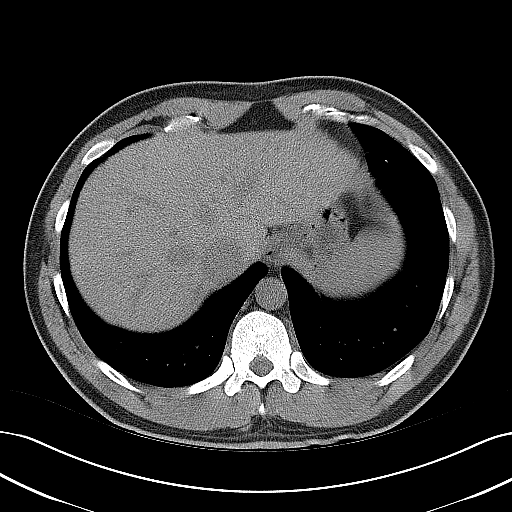
[im 15/29  lung]
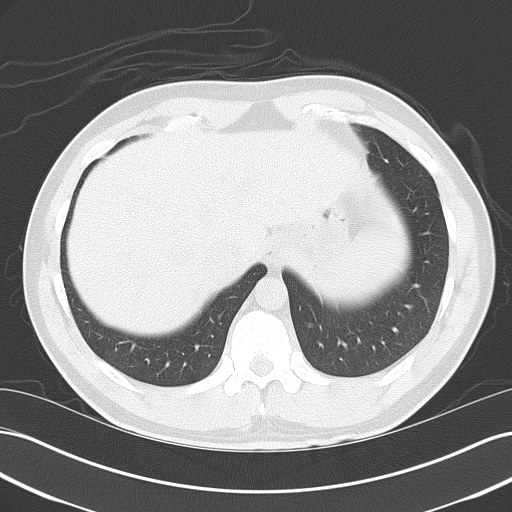
[im 19/29  soft-tissue]
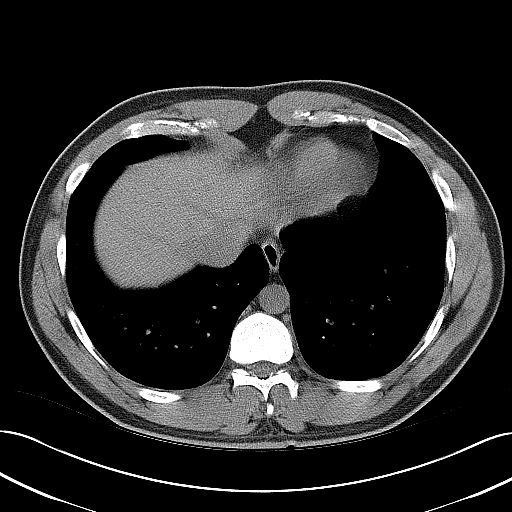
[im 19/29  lung]
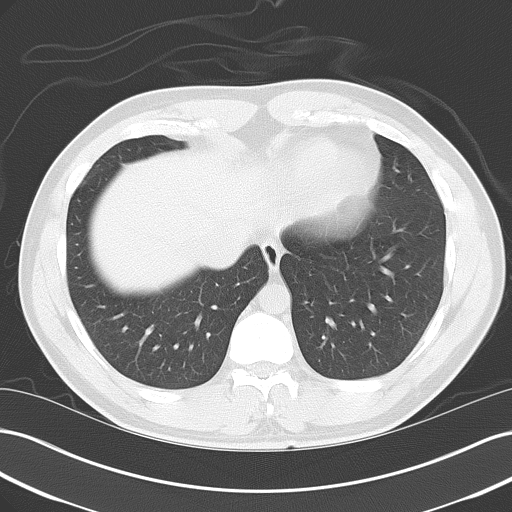
[im 24/29  soft-tissue]
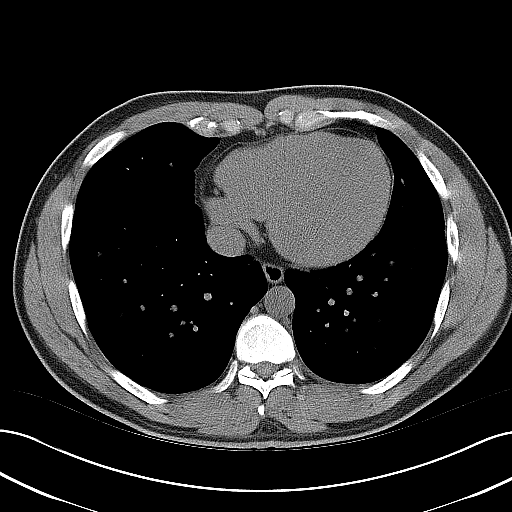
[im 24/29  lung]
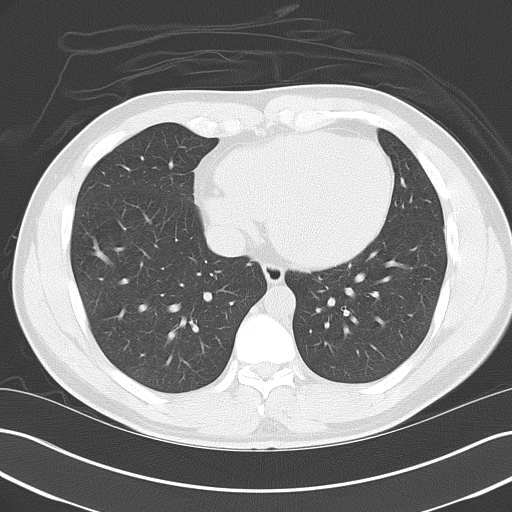

[5 of 32 positions shown; findings below may reference images not displayed]

FINDINGS: The visualized lung bases are clear.

The liver and spleen are unremarkable in appearance. The gallbladder
is within normal limits. The pancreas and adrenal glands are
unremarkable.

There is minimal left-sided hydronephrosis, with prominence of the
left ureter along its entire course, and an obstructing 4 mm stone
noted at the distal left ureter, just above the left vesicoureteral
junction. Mild soft tissue inflammation is noted about the left
renal pelvis. The right kidney is unremarkable in appearance. No
nonobstructing renal stones are seen.

Trace fluid within the pelvis is nonspecific. The small bowel is
unremarkable in appearance. The stomach is within normal limits. No
acute vascular abnormalities are seen.

The appendix is normal in caliber, without evidence of appendicitis.
The colon is unremarkable in appearance.

The bladder is mildly distended and grossly unremarkable. The
prostate remains normal in size. No inguinal lymphadenopathy is
seen.

No acute osseous abnormalities are identified.
IMPRESSION: Minimal left-sided hydronephrosis, with an obstructing 4 mm stone
noted at the distal left ureter, just above the left vesicoureteral
junction.

## 2017-12-26 ENCOUNTER — Encounter (HOSPITAL_COMMUNITY): Payer: Self-pay | Admitting: Emergency Medicine

## 2017-12-26 ENCOUNTER — Other Ambulatory Visit: Payer: Self-pay

## 2017-12-26 ENCOUNTER — Emergency Department (HOSPITAL_COMMUNITY)
Admission: EM | Admit: 2017-12-26 | Discharge: 2017-12-26 | Disposition: A | Discharge status: Home / Self Care | Payer: BLUE CROSS/BLUE SHIELD | Attending: Emergency Medicine | Admitting: Emergency Medicine

## 2017-12-26 DIAGNOSIS — Z79899 Other long term (current) drug therapy: Secondary | ICD-10-CM | POA: Diagnosis not present

## 2017-12-26 DIAGNOSIS — M545 Low back pain, unspecified: Secondary | ICD-10-CM

## 2017-12-26 MED ORDER — HYDROMORPHONE HCL 1 MG/ML IJ SOLN
1.0000 mg | Freq: Once | INTRAMUSCULAR | Status: AC
  Administered 2017-12-26: 1 mg via INTRAMUSCULAR
  Filled 2017-12-26: qty 1

## 2017-12-26 MED ORDER — DIAZEPAM 2 MG PO TABS
2.0000 mg | ORAL_TABLET | Freq: Once | ORAL | Status: AC
  Administered 2017-12-26: 2 mg via ORAL
  Filled 2017-12-26: qty 1

## 2017-12-26 MED ORDER — CYCLOBENZAPRINE HCL 10 MG PO TABS: 10.0000 mg | ORAL_TABLET | Freq: Two times a day (BID) | ORAL | 0 refills | Status: DC | PRN

## 2017-12-26 MED ORDER — IBUPROFEN 800 MG PO TABS: 800.0000 mg | ORAL_TABLET | Freq: Three times a day (TID) | ORAL | 0 refills | Status: DC

## 2017-12-26 MED ORDER — KETOROLAC TROMETHAMINE 30 MG/ML IJ SOLN
30.0000 mg | Freq: Once | INTRAMUSCULAR | Status: AC
  Administered 2017-12-26: 30 mg via INTRAMUSCULAR
  Filled 2017-12-26: qty 1

## 2017-12-26 NOTE — ED Triage Notes (Signed)
Patient was doing squats and thinks he twisted the wrong way. He is feeling a dull and sharp pain.

## 2017-12-26 NOTE — ED Provider Notes (Signed)
[facility]-EMERGENCY DEPT Provider Note   CSN: [id number] Arrival date & time: 12/26/17  0422     History   Chief Complaint Chief Complaint  Patient presents with  . Back Pain    HPI Hector Decker is a 32 y.o. male.  Patient presents to the emergency department with a chief complaint of low back pain.  He states that he was doing squats yesterday and felt a sharp pinch in his back.  He states that he has had pain in his back since.  The pain is worsened with movement and palpation.  He denies any numbness, weakness, or tingling.  Denies any bowel or bladder incontinence.  Denies fevers chills.  He has tried using ice and heat.  He states that his back is been gradually becoming more tight.  The history is provided by the patient. No language interpreter was used.    Past Medical History:  Diagnosis Date  . Depression     Patient Active Problem List   Diagnosis Date Noted  . Suicidal ideation 07/06/2015  . 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 No pertinent surgical history.      Home Medications    Prior to Admission medications   Medication Sig Start Date End Date Taking? Authorizing Provider  escitalopram (LEXAPRO) 20 MG tablet Take 1 tablet (20 mg total) by mouth daily. 07/10/15   Pucilowska, Braulio Conte B, MD  hydrOXYzine (ATARAX/VISTARIL) 25 MG tablet Take 1 tablet (25 mg total) by mouth every 8 (eight) hours as needed for anxiety. 07/10/15   Pucilowska, Braulio Conte B, MD  naproxen (NAPROSYN) 500 MG tablet Take 500 mg by mouth 2 (two) times daily with a meal.    [provider]  ondansetron (ZOFRAN ODT) 4 MG disintegrating tablet Take 1 tablet (4 mg total) by mouth every 8 (eight) hours as needed for nausea or vomiting. 09/07/15   Darci Current, MD  oxyCODONE-acetaminophen (ROXICET) 5-325 MG tablet Take 1 tablet by mouth every 4 (four) hours as needed for severe pain. 09/07/15    Darci Current, MD  traZODone (DESYREL) 100 MG tablet Take 1 tablet (100 mg total) by mouth at bedtime. 07/10/15   Shari Prows, MD    Family History History reviewed. No pertinent family history.  Social History Social History   Tobacco Use  . Smoking status: Never Smoker  . Smokeless tobacco: Never Used  Substance Use Topics  . Alcohol use: Yes    Alcohol/week: 3.6 oz    Types: 6 Cans of beer per week    Comment: 2-3 times a week  . Drug use: Yes    Types: Marijuana    Comment: once a month if that     Allergies   Patient has no known allergies.   Review of Systems Review of Systems  All other systems reviewed and are negative.    Physical Exam Updated Vital Signs BP 125/89 (BP Location: Left Arm)   Pulse 64   Temp (!) 97.5 F (36.4 C) (Oral)   Resp 16   Ht 5\' 9"  (1.753 m)   Wt 77.1 kg (170 lb)   SpO2 100%   BMI 25.10 kg/m   Physical Exam Physical Exam  Constitutional: Pt appears well-developed and well-nourished. No distress.  HENT:  Head: Normocephalic and atraumatic.  Mouth/Throat: Oropharynx is clear and moist. No oropharyngeal exudate.  Eyes: Conjunctivae are normal.  Neck: Normal range of motion. Neck supple.  No meningismus Cardiovascular: Normal rate, regular rhythm and intact distal pulses.   Pulmonary/Chest: Effort normal and breath sounds normal. No respiratory distress. Pt has no wheezes.  Abdominal: Pt exhibits no distension Musculoskeletal:  Right lumbar paraspinal muscle tenderness to palpation, no bony CTLS spine tenderness, deformity, step-off, or crepitus Lymphadenopathy: Pt has no cervical adenopathy.  Neurological: Pt is alert and oriented Speech is clear and goal oriented, follows commands Normal 5/5 strength in upper and lower extremities bilaterally including dorsiflexion and plantar flexion, strong and equal grip strength Sensation intact Great toe extension intact Moves extremities without ataxia, coordination  intact Ankle and knee jerk reflexes intact and symmetrical  Normal gait Normal balance No Clonus Skin: Skin is warm and dry. No rash noted. Pt is not diaphoretic. No erythema.  Psychiatric: Pt has a normal mood and affect. Behavior is normal.  Nursing note and vitals reviewed.   ED Treatments / Results  Labs (all labs ordered are listed, but only abnormal results are displayed) Labs Reviewed - No data to display  EKG None  Radiology No results found.  Procedures Procedures (including critical care time)  Medications Ordered in ED Medications  ketorolac (TORADOL) 30 MG/ML injection 30 mg (has no administration in time range)  HYDROmorphone (DILAUDID) injection 1 mg (has no administration in time range)  diazepam (VALIUM) tablet 2 mg (has no administration in time range)     Initial Impression / Assessment and Plan / ED Course  I have reviewed the triage vital signs and the nursing notes.  Pertinent labs & imaging results that were available during my care of the patient were reviewed by me and considered in my medical decision making (see chart for details).     Patient with back pain.    No neurological deficits and normal neuro exam.  Patient is ambulatory.  No loss of bowel or bladder control.  Doubt cauda equina.  Denies fever,  doubt epidural abscess or other lesion. Recommend back exercises, stretching, RICE, and will treat with a short course of flexeril and ibuprofen.  Encouraged the patient that there could be a need for additional workup and/or imaging such as MRI, if the symptoms do not resolve. Patient advised that if the back pain does not resolve, or radiates, this could progress to more serious conditions and is encouraged to follow-up with PCP or orthopedics within 2 weeks.     Final Clinical Impressions(s) / ED Diagnoses   Final diagnoses:  Acute right-sided low back pain without sciatica    ED Discharge Orders        Ordered    cyclobenzaprine  (FLEXERIL) 10 MG tablet  2 times daily PRN     12/26/17 0545    ibuprofen (ADVIL,MOTRIN) 800 MG tablet  3 times daily     12/26/17 0545       Roxy HorsemanBrowning, Denaya Horn, PA-C 12/26/17 0546    Palumbo, April, MD 12/26/17 [phone]

## 2019-01-07 DIAGNOSIS — F411 Generalized anxiety disorder: Secondary | ICD-10-CM | POA: Insufficient documentation

## 2019-02-28 ENCOUNTER — Other Ambulatory Visit: Payer: Self-pay

## 2019-02-28 DIAGNOSIS — Z20822 Contact with and (suspected) exposure to covid-19: Secondary | ICD-10-CM

## 2019-03-02 LAB — NOVEL CORONAVIRUS, NAA: SARS-CoV-2, NAA: NOT DETECTED

## 2020-03-21 ENCOUNTER — Encounter (HOSPITAL_COMMUNITY): Payer: Self-pay | Admitting: *Deleted

## 2020-03-21 ENCOUNTER — Emergency Department (HOSPITAL_COMMUNITY)
Admission: EM | Admit: 2020-03-21 | Discharge: 2020-03-21 | Disposition: A | Discharge status: Home / Self Care | Payer: Self-pay | Attending: Emergency Medicine | Admitting: Emergency Medicine

## 2020-03-21 ENCOUNTER — Emergency Department (HOSPITAL_COMMUNITY): Payer: Self-pay

## 2020-03-21 ENCOUNTER — Other Ambulatory Visit: Payer: Self-pay

## 2020-03-21 DIAGNOSIS — N200 Calculus of kidney: Secondary | ICD-10-CM | POA: Insufficient documentation

## 2020-03-21 DIAGNOSIS — R112 Nausea with vomiting, unspecified: Secondary | ICD-10-CM | POA: Insufficient documentation

## 2020-03-21 LAB — URINALYSIS, ROUTINE W REFLEX MICROSCOPIC
Bilirubin Urine: NEGATIVE
Glucose, UA: NEGATIVE mg/dL
Ketones, ur: NEGATIVE mg/dL
Leukocytes,Ua: NEGATIVE
Nitrite: NEGATIVE
Protein, ur: 30 mg/dL — AB
RBC / HPF: >50 RBC/hpf — ABNORMAL HIGH (ref 0–5)
Specific Gravity, Urine: 1.021 (ref 1.005–1.030)
pH: 5 (ref 5.0–8.0)

## 2020-03-21 NOTE — ED Triage Notes (Addendum)
Pt reports sensation of having to frequently urinate yesterday. Woke up 0300 with left flank pain and n/v. Still has urgency but pain has improved and has been able to urinate this am. History of kidney stones. No acute distress is noted at triage.

## 2020-03-21 NOTE — ED Provider Notes (Signed)
[facility] EMERGENCY DEPARTMENT Provider Note   CSN: [id number] Arrival date & time: 03/21/20  6759     History Chief Complaint  Patient presents with  . Flank Pain    Hector Decker is a 34 y.o. male with PMHx depression who presents to the ED today with complaint of sudden onset, constant, gradually improving, sharp, L flank pain that began around 3 AM this morning.  Patient reports he woke up with the pain.  He states he has also been having urinary frequency and urgency.  He states he does not feel like he is voiding all the way.  He had 3 episodes of nonbloody nonbilious emesis due to significant amount of pain prompting him to come to the ED.  Patient does have history of kidney stones, last in 2016 which she was able to pass on his own.  He states this feels similar.  He reports that around 6 AM his pain dissipated.  He is unsure if he passed the kidney stone.  He states he went to the shower and it was dark in the bathroom so he could not see however he has only had some mild achy pain to his left side since then.  No more vomiting.  He was able to drink a 20 ounce soda in the waiting room without difficulty.  No more emesis.  Patient denies fevers or chills.   The history is provided by the patient and medical records.       Past Medical History:  Diagnosis Date  . Depression     Patient Active Problem List   Diagnosis Date Noted  . Suicidal ideation 07/06/2015  . 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 No pertinent surgical history.     History reviewed. No pertinent family history.  Social History   Tobacco Use  . Smoking status: Never Smoker  . Smokeless tobacco: Never Used  Substance Use Topics  . Alcohol use: Yes    Alcohol/week: 6.0 standard drinks    Types: 6 Cans of beer per week    Comment: 2-3 times a week  . Drug use: Yes    Types: Marijuana    Comment: once a month if  that    Home Medications Prior to Admission medications   Medication Sig Start Date End Date Taking? Authorizing Provider  cyclobenzaprine (FLEXERIL) 10 MG tablet Take 1 tablet (10 mg total) by mouth 2 (two) times daily as needed for muscle spasms. 12/26/17   Roxy Horseman, PA-C  escitalopram (LEXAPRO) 20 MG tablet Take 1 tablet (20 mg total) by mouth daily. 07/10/15   Pucilowska, Braulio Conte B, MD  hydrOXYzine (ATARAX/VISTARIL) 25 MG tablet Take 1 tablet (25 mg total) by mouth every 8 (eight) hours as needed for anxiety. 07/10/15   Pucilowska, Braulio Conte B, MD  ibuprofen (ADVIL,MOTRIN) 800 MG tablet Take 1 tablet (800 mg total) by mouth 3 (three) times daily. 12/26/17   Roxy Horseman, PA-C  naproxen (NAPROSYN) 500 MG tablet Take 500 mg by mouth 2 (two) times daily with a meal.    [provider]  ondansetron (ZOFRAN ODT) 4 MG disintegrating tablet Take 1 tablet (4 mg total) by mouth every 8 (eight) hours as needed for nausea or vomiting. 09/07/15   Darci Current, MD  oxyCODONE-acetaminophen (ROXICET) 5-325 MG tablet Take 1 tablet by mouth every 4 (four) hours as needed for severe pain. 09/07/15   Darci Current, MD  traZODone (  DESYREL) 100 MG tablet Take 1 tablet (100 mg total) by mouth at bedtime. 07/10/15   Pucilowska, Ellin Goodie, MD    Allergies    Patient has no known allergies.  Review of Systems   Review of Systems  Constitutional: Negative for chills and fever.  Gastrointestinal: Positive for nausea and vomiting. Negative for constipation and diarrhea.  Genitourinary: Positive for flank pain, frequency and urgency. Negative for penile swelling, scrotal swelling and testicular pain.  All other systems reviewed and are negative.   Physical Exam Updated Vital Signs BP (!) 121/94 (BP Location: Left Arm)   Pulse (!) 50   Temp 98.1 F (36.7 C) (Oral)   Resp 14   Ht 5\' 8"  (1.727 m)   Wt 83.9 kg   SpO2 100%   BMI 28.13 kg/m   Physical Exam Vitals and nursing note  reviewed.  Constitutional:      Appearance: He is not ill-appearing or diaphoretic.  HENT:     Head: Normocephalic and atraumatic.  Eyes:     Conjunctiva/sclera: Conjunctivae normal.  Cardiovascular:     Rate and Rhythm: Normal rate and regular rhythm.     Pulses: Normal pulses.  Pulmonary:     Effort: Pulmonary effort is normal.     Breath sounds: Normal breath sounds. No wheezing, rhonchi or rales.  Abdominal:     Palpations: Abdomen is soft.     Tenderness: There is no abdominal tenderness. There is left CVA tenderness. There is no right CVA tenderness, guarding or rebound.  Musculoskeletal:     Cervical back: Neck supple.  Skin:    General: Skin is warm and dry.  Neurological:     Mental Status: He is alert.     ED Results / Procedures / Treatments   Labs (all labs ordered are listed, but only abnormal results are displayed) Labs Reviewed  URINALYSIS, ROUTINE W REFLEX MICROSCOPIC - Abnormal; Notable for the following components:      Result Value   Color, Urine AMBER (*)    APPearance HAZY (*)    Hgb urine dipstick LARGE (*)    Protein, ur 30 (*)    RBC / HPF >50 (*)    Bacteria, UA RARE (*)    All other components within normal limits  I-STAT CHEM 8, ED    EKG None  Radiology Renal  Result Date: 03/21/2020 CLINICAL DATA:  Acute left flank pain. EXAM: RENAL / URINARY TRACT ULTRASOUND COMPLETE COMPARISON:  September 07, 2015. FINDINGS: Right Kidney: Renal measurements: 11.4 x 4.9 x 4.3 = volume: 124 mL. Echogenicity within normal limits. No mass or hydronephrosis visualized. Left Kidney: Renal measurements: 12.0 x 5.0 x 5.7 cm = volume: 177 mL. Echogenicity within normal limits. No mass or hydronephrosis visualized. Bladder: Appears normal for degree of bladder distention. Other: None. IMPRESSION: Normal renal ultrasound. Electronically Signed   By: September 09, 2015 M.D.   On: 03/21/2020 09:59    Procedures Procedures (including critical care  time)  Medications Ordered in ED Medications - No data to display  ED Course  I have reviewed the triage vital signs and the nursing notes.  Pertinent labs & imaging results that were available during my care of the patient were reviewed by me and considered in my medical decision making (see chart for details).    MDM Rules/Calculators/A&P                          34 year old  otherwise healthy male who presents to the ED with left-sided flank pain that began around 3 AM this morning with associated nausea, nonbloody nonbilious emesis.  History of kidney stones and states this feels similar.  Pain resolved after 6 AM this morning and he has been able to tolerate liquids in the waiting room without difficulty.  Urinalysis was obtained while patient was in the waiting room which showed large hemoglobin and greater than 50 red blood cells per high-power field.  And no white blood cells, no leuks, no nitrites.  Calcium oxalate crystals present as well as hyaline casts.  Ultrasound of kidneys without any signs of hydronephrosis.  On exam patient is sitting comfortably upright in chair.  States that he has some achy pain to the left kidney however is feeling much improved since being in the waiting room.  We will plan to check kidney function and then the discharge home.  Will provide Toradol if kidney function not elevated.  Will have patient follow-up with urologist, he states he never followed up with one after 2016 however given this is now recurrent issue will provide information for follow-up.  We will plan to discharge home with Flomax given complaint of urgency as well as Zofran.   12:47 PM Nursing staff informed that pt told them he was leaving. His kidney function level was not checked and I was unable to speak with him prior to leaving including risks vs benefits. Pt was overall well appearing and stable however unable to provide adequate follow up for patient or provide medications for him to  take at home.   This note was prepared using Dragon voice recognition software and may include unintentional dictation errors due to the inherent limitations of voice recognition software.  Final Clinical Impression(s) / ED Diagnoses Final diagnoses:  Kidney stone    Rx / DC Orders ED Discharge Orders    None       Tanda Rockers, PA-C 03/21/20 1248    Little, Ambrose Finland, MD 03/21/20 1256

## 2020-03-21 NOTE — ED Notes (Signed)
PT states he is leaving.  States the Dr. Talked to him and told him he was going to be discharged and receive medication.  Pt states he doesn't want the medication or his discharge instructions.  Ambulatory to ED entrance.

## 2020-04-09 ENCOUNTER — Ambulatory Visit: Payer: BC Managed Care – PPO | Admitting: Urology

## 2020-04-09 ENCOUNTER — Other Ambulatory Visit: Payer: Self-pay

## 2020-04-09 ENCOUNTER — Encounter: Payer: Self-pay | Admitting: Urology

## 2020-04-09 VITALS — BP 128/77 | HR 66 | Ht 68.0 in | Wt 179.5 lb

## 2020-04-09 DIAGNOSIS — R3129 Other microscopic hematuria: Secondary | ICD-10-CM

## 2020-04-09 DIAGNOSIS — R3915 Urgency of urination: Secondary | ICD-10-CM

## 2020-04-09 DIAGNOSIS — N200 Calculus of kidney: Secondary | ICD-10-CM

## 2020-04-09 NOTE — Progress Notes (Signed)
   04/09/20 3:42 PM   Hector Decker 1986-07-05 [id number]  CC: Kidney stones, urinary urgency  HPI: I saw Hector Decker in urology clinic for the above issues.  He is a 34 year old male with 3 prior stone episodes who recently was seen in the ED on 03/21/2020 with left-sided flank pain.  Renal ultrasound was benign, however urinalysis showed greater than 50 RBCs and clinical presentation was consistent with a kidney stone.  He reports his pain has since completely resolved and he suspects he passed his stone.  He denies any history of gross hematuria.  He is unsure what type of stone he has had previously.  He has never required surgery for kidney stones before.  He also has long-term urinary urgency and occasional urge incontinence.  He drinks water and soda during the day.  Urinalysis today is benign with 0-5 WBCs, 0-2 RBCs, no bacteria, nitrate negative, no leukocytes  Social History:  reports that he has never smoked. He has never used smokeless tobacco. He reports current alcohol use of about 6.0 standard drinks of alcohol per week. He reports current drug use. Drug: Marijuana.  Physical Exam: BP 128/77 (BP Location: Left Arm, Patient Position: Sitting, Cuff Size: Normal)   Pulse 66   Ht 5\' 8"  (1.727 m)   Wt 179 lb 8 oz (81.4 kg)   BMI 27.29 kg/m    Constitutional:  Alert and oriented, No acute distress. Cardiovascular: No clubbing, cyanosis, or edema. Respiratory: Normal respiratory effort, no increased work of breathing.  Laboratory Data: Reviewed, see HPI  Pertinent Imaging: I personally reviewed the renal ultrasound from 8/25 that shows no hydronephrosis  Assessment & Plan:   In summary, he is a healthy 34 year old male with 3 prior stone events, and baseline urinary urgency and occasional urge incontinence.  We discussed behavioral strategies at length regarding his urinary urgency, and avoiding bladder irritants including coffee, soda, tea, and diet drinks.  We discussed  general stone prevention strategies including adequate hydration with goal of producing 2.5 L of urine daily, increasing citric acid intake, increasing calcium intake during high oxalate meals, minimizing animal protein, and decreasing salt intake. Information about dietary recommendations given today.   I recommended 24-hour urine and PTH for further evaluation of his recurrent stone disease at a young age Behavioral strategies discussed regarding urinary urgency  36, MD 04/09/2020  [facility] [address] [place], [place] [phone]

## 2020-04-09 NOTE — Patient Instructions (Signed)
[facility] Instructions [facility]  You will receive a box/kit in the mail that will have a urine jug and instructions in the kit.  When the box arrives you will need to call our office [phone] to schedule a LAB appointment.  You will need to do a 24hour urine and this should be done during the days that our office will be open.  For example any day from Sunday through Thursday.  If you take Vitamin C 100mg or greater please stop this 5 days prior to collection.  How to collect the urine sample: On the day you start the urine sample this 1st morning urine should NOT be collected.  For the rest of the day including all night urines should be collected.  On the next morning the 1st urine should be collected and then you will be finished with the urine collections.  You will need to bring the box with you on your LAB appointment day after urine has been collected and all instructions are complete in the box.  Your blood will be drawn and the box will be collected by our Lab employee to be sent off for analysis.  When urine and blood is complete you will need to schedule a follow up appointment for lab results.  Dietary Guidelines to Help Prevent Kidney Stones Kidney stones are deposits of minerals and salts that form inside your kidneys. Your risk of developing kidney stones may be greater depending on your diet, your lifestyle, the medicines you take, and whether you have certain medical conditions. Most people can reduce their chances of developing kidney stones by following the instructions below. Depending on your overall health and the type of kidney stones you tend to develop, your dietitian may give you more specific instructions. What are tips for following this plan? Reading food labels  Choose foods with "no salt added" or "low-salt" labels. Limit your sodium intake to less than 1500 mg per day.  Choose foods with calcium for each meal and snack. Try to eat  about 300 mg of calcium at each meal. Foods that contain 200-500 mg of calcium per serving include: ? 8 oz (237 ml) of milk, fortified nondairy milk, and fortified fruit juice. ? 8 oz (237 ml) of kefir, yogurt, and soy yogurt. ? 4 oz (118 ml) of tofu. ? 1 oz of cheese. ? 1 cup (300 g) of dried figs. ? 1 cup (91 g) of cooked broccoli. ? 1-3 oz can of sardines or mackerel.  Most people need 1000 to 1500 mg of calcium each day. Talk to your dietitian about how much calcium is recommended for you. Shopping  Buy plenty of fresh fruits and vegetables. Most people do not need to avoid fruits and vegetables, even if they contain nutrients that may contribute to kidney stones.  When shopping for convenience foods, choose: ? Whole pieces of fruit. ? Premade salads with dressing on the side. ? Low-fat fruit and yogurt smoothies.  Avoid buying frozen meals or prepared deli foods.  Look for foods with live cultures, such as yogurt and kefir. Cooking  Do not add salt to food when cooking. Place a salt shaker on the table and allow each person to add his or her own salt to taste.  Use vegetable protein, such as beans, textured vegetable protein (TVP), or tofu instead of meat in pasta, casseroles, and soups. Meal planning   Eat less salt, if told by your dietitian. To do this: ? Avoid eating processed   or premade food. ? Avoid eating fast food.  Eat less animal protein, including cheese, meat, poultry, or fish, if told by your dietitian. To do this: ? Limit the number of times you have meat, poultry, fish, or cheese each week. Eat a diet free of meat at least 2 days a week. ? Eat only one serving each day of meat, poultry, fish, or seafood. ? When you prepare animal protein, cut pieces into small portion sizes. For most meat and fish, one serving is about the size of one deck of cards.  Eat at least 5 servings of fresh fruits and vegetables each day. To do this: ? Keep fruits and vegetables on  hand for snacks. ? Eat 1 piece of fruit or a handful of berries with breakfast. ? Have a salad and fruit at lunch. ? Have two kinds of vegetables at dinner.  Limit foods that are high in a substance called oxalate. These include: ? Spinach. ? Rhubarb. ? Beets. ? Potato chips and french fries. ? Nuts.  If you regularly take a diuretic medicine, make sure to eat at least 1-2 fruits or vegetables high in potassium each day. These include: ? Avocado. ? Banana. ? Orange, prune, carrot, or tomato juice. ? Baked potato. ? Cabbage. ? Beans and split peas. General instructions   Drink enough fluid to keep your urine clear or pale yellow. This is the most important thing you can do.  Talk to your health care provider and dietitian about taking daily supplements. Depending on your health and the cause of your kidney stones, you may be advised: ? Not to take supplements with vitamin C. ? To take a calcium supplement. ? To take a daily probiotic supplement. ? To take other supplements such as magnesium, fish oil, or vitamin B6.  Take all medicines and supplements as told by your health care provider.  Limit alcohol intake to no more than 1 drink a day for nonpregnant women and 2 drinks a day for men. One drink equals 12 oz of beer, 5 oz of wine, or 1 oz of hard liquor.  Lose weight if told by your health care provider. Work with your dietitian to find strategies and an eating plan that works best for you. What foods are not recommended? Limit your intake of the following foods, or as told by your dietitian. Talk to your dietitian about specific foods you should avoid based on the type of kidney stones and your overall health. Grains Breads. Bagels. Rolls. Baked goods. Salted crackers. Cereal. Pasta. Vegetables Spinach. Rhubarb. Beets. Canned vegetables. Pickles. Olives. Meats and other protein foods Nuts. Nut butters. Large portions of meat, poultry, or fish. Salted or cured meats. Deli  meats. Hot dogs. Sausages. Dairy Cheese. Beverages Regular soft drinks. Regular vegetable juice. Seasonings and other foods Seasoning blends with salt. Salad dressings. Canned soups. Soy sauce. Ketchup. Barbecue sauce. Canned pasta sauce. Casseroles. Pizza. Lasagna. Frozen meals. Potato chips. French fries. Summary  You can reduce your risk of kidney stones by making changes to your diet.  The most important thing you can do is drink enough fluid. You should drink enough fluid to keep your urine clear or pale yellow.  Ask your health care provider or dietitian how much protein from animal sources you should eat each day, and also how much salt and calcium you should have each day. This information is not intended to replace advice given to you by your health care provider. Make sure you discuss any   questions you have with your health care provider. Document Revised: 11/03/2018 Document Reviewed: 06/24/2016 Elsevier Patient Education  2020 [employer]  

## 2020-04-10 LAB — URINALYSIS, COMPLETE
Bilirubin, UA: NEGATIVE
Glucose, UA: NEGATIVE
Ketones, UA: NEGATIVE
Leukocytes,UA: NEGATIVE
Nitrite, UA: NEGATIVE
Protein,UA: NEGATIVE
RBC, UA: NEGATIVE
Specific Gravity, UA: 1.015 (ref 1.005–1.030)
Urobilinogen, Ur: 0.2 mg/dL (ref 0.2–1.0)
pH, UA: 6 (ref 5.0–7.5)

## 2020-04-10 LAB — MICROSCOPIC EXAMINATION
Bacteria, UA: NONE SEEN
Epithelial Cells (non renal): NONE SEEN /hpf (ref 0–10)

## 2020-04-11 LAB — PTH, INTACT AND CALCIUM
Calcium: 9.8 mg/dL (ref 8.7–10.2)
PTH: 20 pg/mL (ref 15–65)

## 2020-04-12 ENCOUNTER — Telehealth: Payer: Self-pay

## 2020-04-12 NOTE — Telephone Encounter (Signed)
-----   Message from Sondra Come, MD sent at 04/12/2020  7:58 AM EDT ----- Calcium and PTH normal, keep scheduled follow up for 24 hr urine   Legrand Rams, MD 04/12/2020

## 2020-04-12 NOTE — Telephone Encounter (Signed)
Called pt informed him of the information below. Pt gave verbal understanding.  

## 2020-06-13 ENCOUNTER — Ambulatory Visit: Payer: Self-pay | Admitting: Urology

## 2020-06-14 ENCOUNTER — Encounter: Payer: Self-pay | Admitting: Urology

## 2021-12-15 IMAGING — US US RENAL
1 series · 14 of 25 positions shown · non-contrast
Comparison: September 07, 2015.

CLINICAL DATA: Acute left flank pain.

EXAM:
RENAL / URINARY TRACT ULTRASOUND COMPLETE

[Series 1: us renal · 14 of 34 slices shown]
[im 1/34]
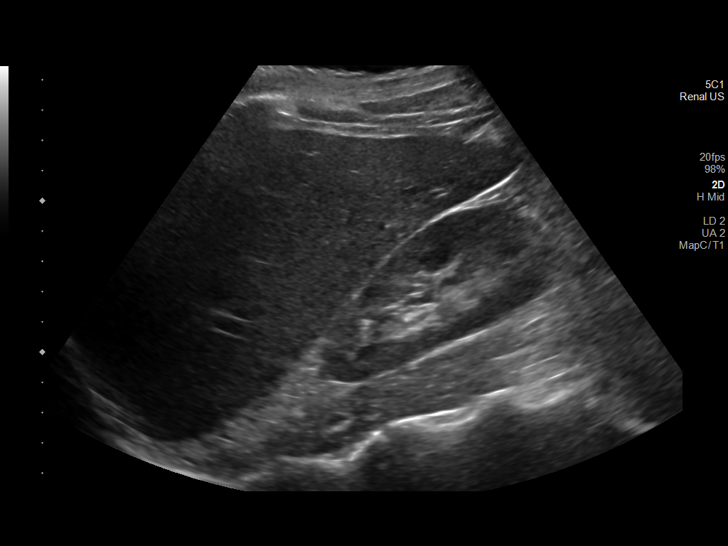
[im 3/34]
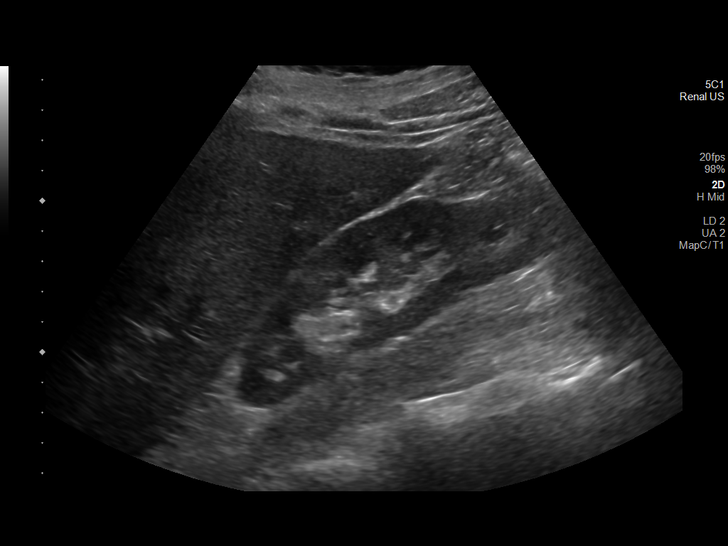
[im 6/34]
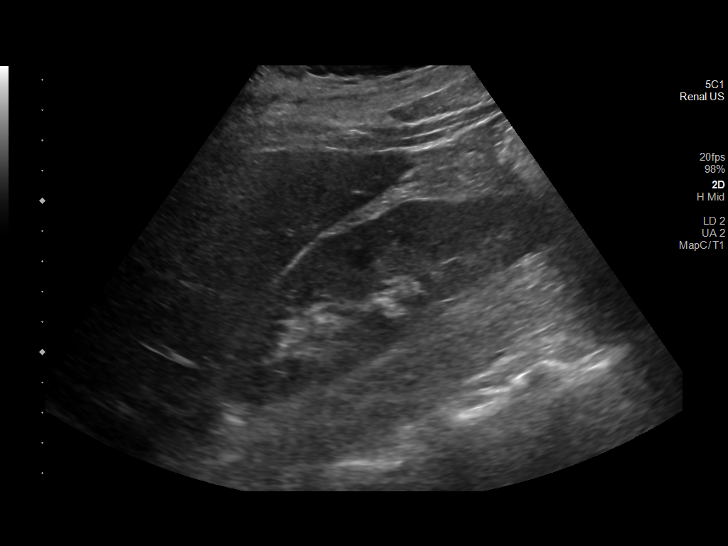
[im 9/34]
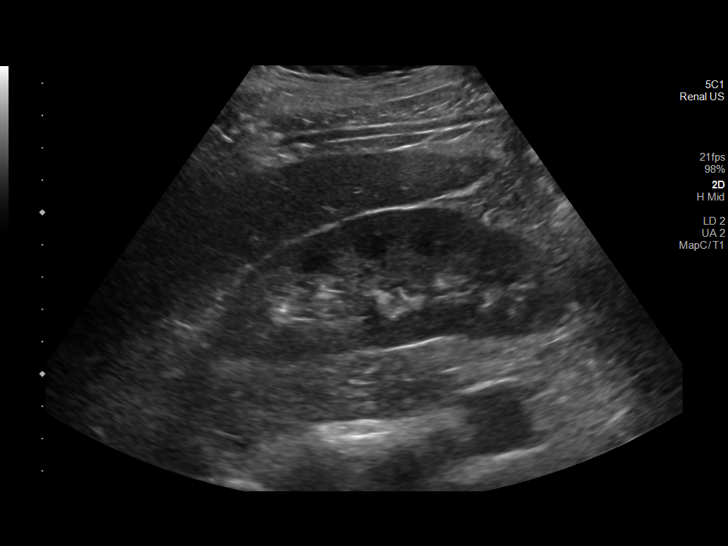
[im 12/34]
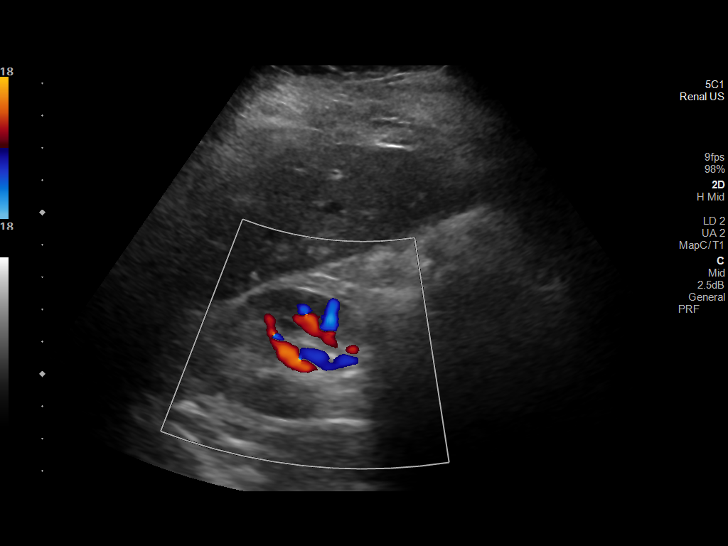
[im 13/34]
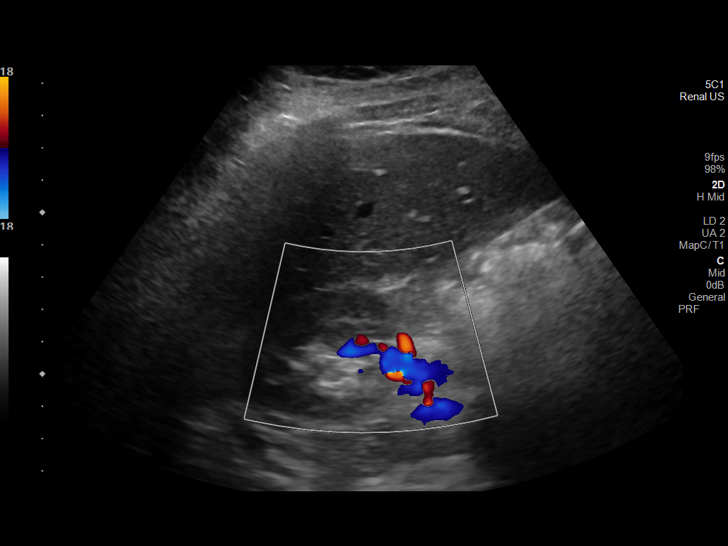
[im 16/34]
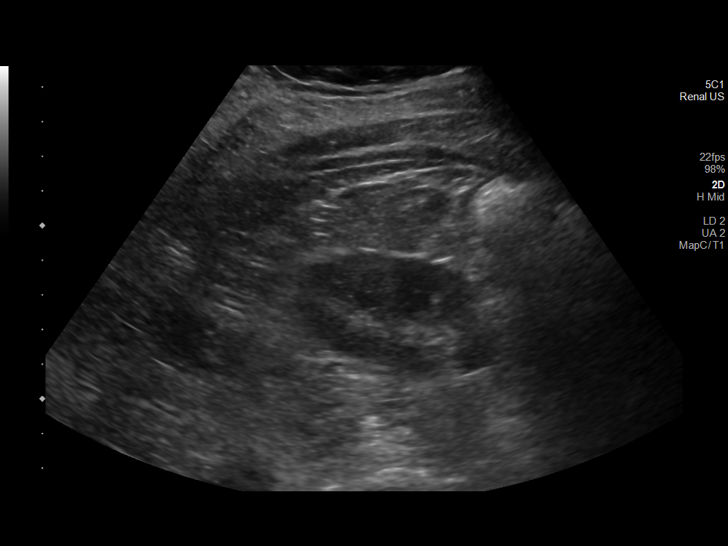
[im 18/34]
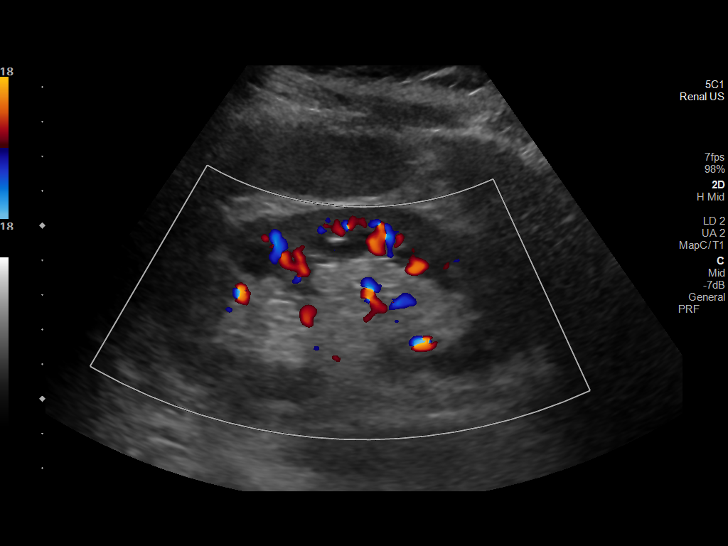
[im 21/34]
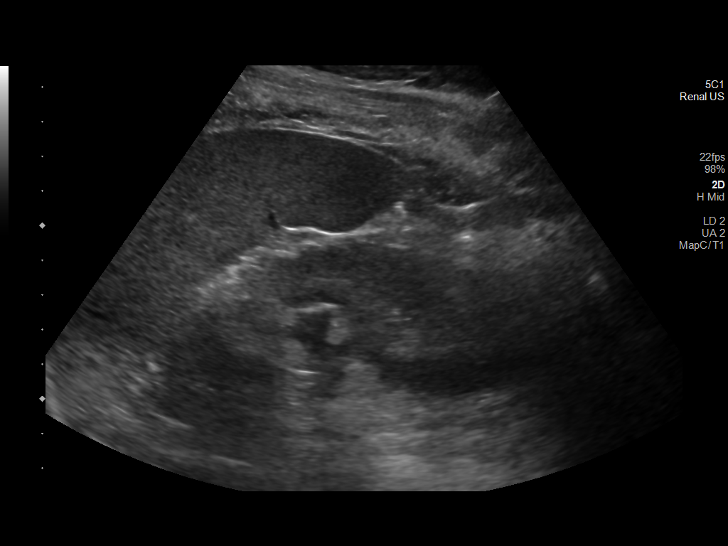
[im 23/34]
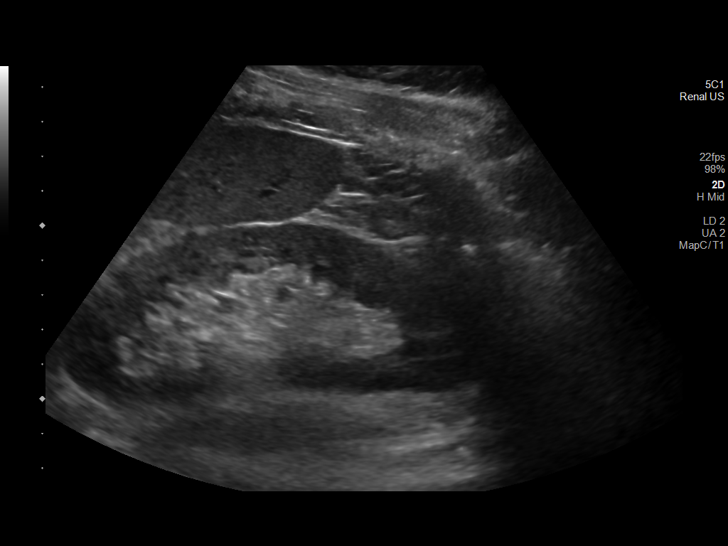
[im 25/34]
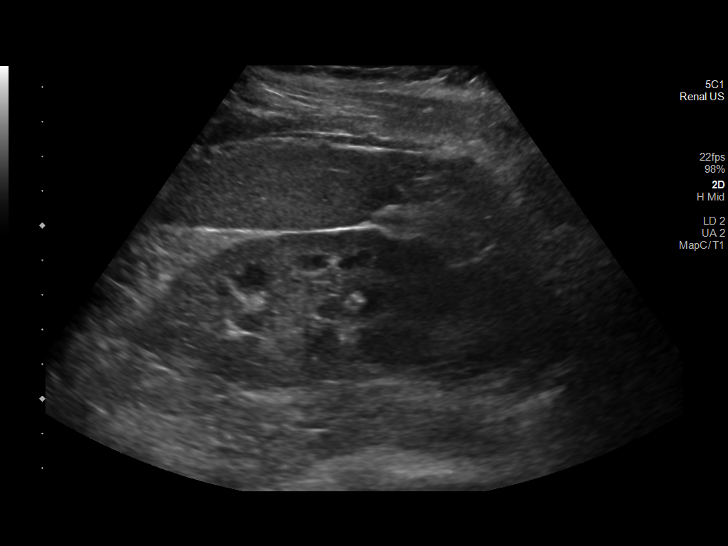
[im 28/34]
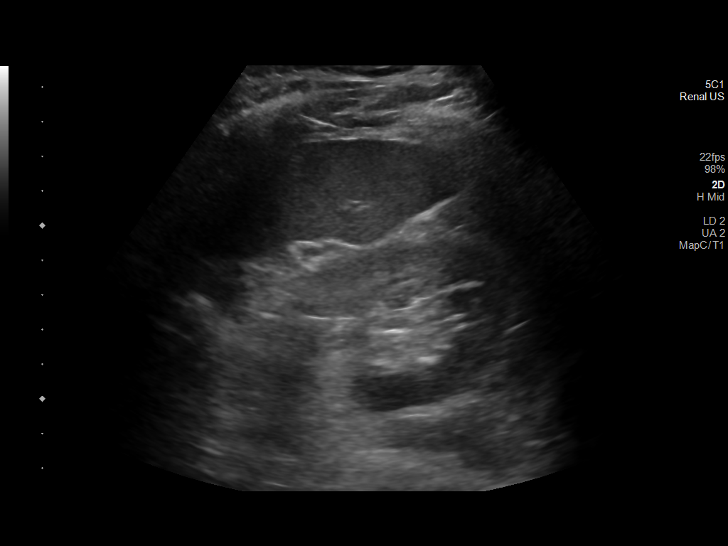
[im 31/34]
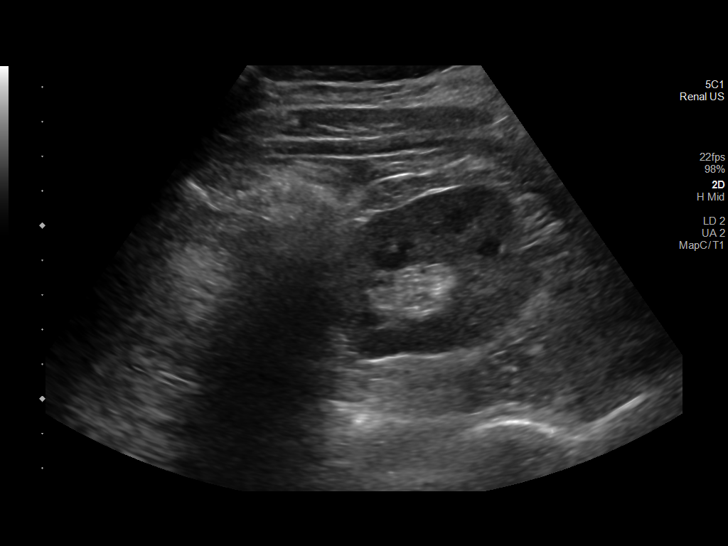
[im 34/34]
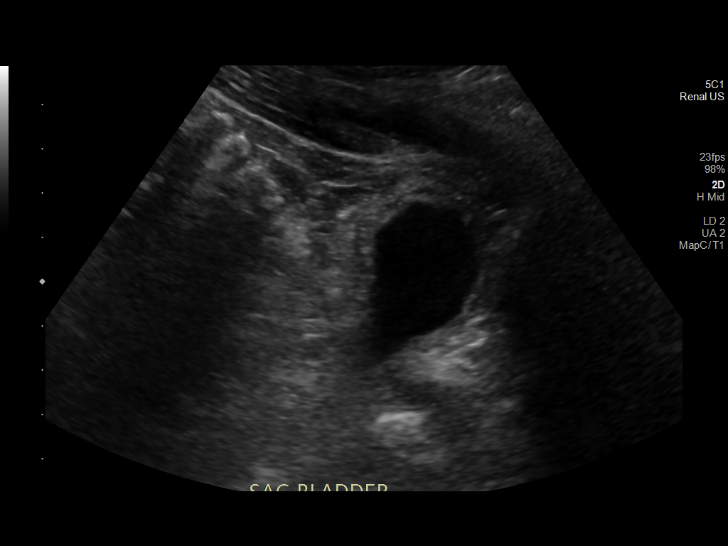

[14 of 25 positions shown; findings below may reference images not displayed]

FINDINGS: Right Kidney:

Renal measurements: 11.4 x 4.9 x 4.3 = volume: 124 mL. Echogenicity
within normal limits. No mass or hydronephrosis visualized.

Left Kidney:

Renal measurements: 12.0 x 5.0 x 5.7 cm = volume: 177 mL.
Echogenicity within normal limits. No mass or hydronephrosis
visualized.

Bladder:

Appears normal for degree of bladder distention.

Other:

None.
IMPRESSION: Normal renal ultrasound.

## 2022-02-12 ENCOUNTER — Other Ambulatory Visit: Payer: Self-pay | Admitting: Physician Assistant

## 2022-02-12 DIAGNOSIS — M5416 Radiculopathy, lumbar region: Secondary | ICD-10-CM

## 2022-02-21 ENCOUNTER — Ambulatory Visit
Admission: RE | Admit: 2022-02-21 | Discharge: 2022-02-21 | Disposition: A | Discharge status: Home / Self Care | Payer: Worker's Compensation | Source: Ambulatory Visit | Attending: Physician Assistant | Admitting: Physician Assistant

## 2022-02-21 DIAGNOSIS — M5416 Radiculopathy, lumbar region: Secondary | ICD-10-CM
